# Patient Record
Sex: Female | Born: 1975 | Race: White | Hispanic: No | Marital: Married | Smoking: Current every day smoker
Health system: Southern US, Community
[De-identification: ages and names within clinical notes are randomized; demographics above are authoritative.]

## PROBLEM LIST (undated history)

## (undated) DIAGNOSIS — Z87442 Personal history of urinary calculi: Secondary | ICD-10-CM

## (undated) DIAGNOSIS — F419 Anxiety disorder, unspecified: Secondary | ICD-10-CM

## (undated) DIAGNOSIS — F1110 Opioid abuse, uncomplicated: Secondary | ICD-10-CM

## (undated) DIAGNOSIS — K529 Noninfective gastroenteritis and colitis, unspecified: Secondary | ICD-10-CM

## (undated) DIAGNOSIS — N200 Calculus of kidney: Secondary | ICD-10-CM

## (undated) HISTORY — PX: CHOLECYSTECTOMY: SHX55

## (undated) HISTORY — PX: SHOULDER SURGERY: SHX246

## (undated) HISTORY — PX: KNEE SURGERY: SHX244

## (undated) HISTORY — PX: OVARIAN CYST REMOVAL: SHX89

## (undated) HISTORY — PX: ABDOMINAL HYSTERECTOMY: SHX81

---

## 1998-10-12 ENCOUNTER — Other Ambulatory Visit: Admission: RE | Admit: 1998-10-12 | Discharge: 1998-10-12 | Payer: Self-pay | Admitting: Obstetrics and Gynecology

## 1999-01-27 ENCOUNTER — Other Ambulatory Visit: Admission: RE | Admit: 1999-01-27 | Discharge: 1999-01-27 | Payer: Self-pay | Admitting: Obstetrics and Gynecology

## 1999-03-09 ENCOUNTER — Encounter (INDEPENDENT_AMBULATORY_CARE_PROVIDER_SITE_OTHER): Payer: Self-pay | Admitting: Specialist

## 1999-03-09 ENCOUNTER — Other Ambulatory Visit: Admission: RE | Admit: 1999-03-09 | Discharge: 1999-03-09 | Payer: Self-pay | Admitting: *Deleted

## 1999-04-15 ENCOUNTER — Other Ambulatory Visit: Admission: RE | Admit: 1999-04-15 | Discharge: 1999-04-15 | Payer: Self-pay | Admitting: Obstetrics and Gynecology

## 1999-04-15 ENCOUNTER — Encounter (INDEPENDENT_AMBULATORY_CARE_PROVIDER_SITE_OTHER): Payer: Self-pay

## 1999-10-04 ENCOUNTER — Other Ambulatory Visit: Admission: RE | Admit: 1999-10-04 | Discharge: 1999-10-04 | Payer: Self-pay | Admitting: *Deleted

## 2000-04-18 ENCOUNTER — Other Ambulatory Visit: Admission: RE | Admit: 2000-04-18 | Discharge: 2000-04-18 | Payer: Self-pay | Admitting: *Deleted

## 2005-03-28 HISTORY — PX: DILATION AND CURETTAGE OF UTERUS: SHX78

## 2007-09-18 ENCOUNTER — Emergency Department (HOSPITAL_COMMUNITY): Admission: EM | Admit: 2007-09-18 | Discharge: 2007-09-18 | Payer: Self-pay | Admitting: Emergency Medicine

## 2008-04-08 ENCOUNTER — Emergency Department (HOSPITAL_BASED_OUTPATIENT_CLINIC_OR_DEPARTMENT_OTHER): Admission: EM | Admit: 2008-04-08 | Discharge: 2008-04-08 | Payer: Self-pay | Admitting: Emergency Medicine

## 2008-08-13 ENCOUNTER — Emergency Department (HOSPITAL_BASED_OUTPATIENT_CLINIC_OR_DEPARTMENT_OTHER): Admission: EM | Admit: 2008-08-13 | Discharge: 2008-08-13 | Payer: Self-pay | Admitting: Emergency Medicine

## 2008-08-13 ENCOUNTER — Ambulatory Visit: Payer: Self-pay | Admitting: Interventional Radiology

## 2008-12-09 ENCOUNTER — Ambulatory Visit: Payer: Self-pay | Admitting: Radiology

## 2008-12-09 ENCOUNTER — Emergency Department (HOSPITAL_BASED_OUTPATIENT_CLINIC_OR_DEPARTMENT_OTHER): Admission: EM | Admit: 2008-12-09 | Discharge: 2008-12-09 | Payer: Self-pay | Admitting: Emergency Medicine

## 2009-01-16 ENCOUNTER — Encounter: Admission: RE | Admit: 2009-01-16 | Discharge: 2009-01-16 | Payer: Self-pay | Admitting: Orthopaedic Surgery

## 2009-06-10 ENCOUNTER — Emergency Department (HOSPITAL_BASED_OUTPATIENT_CLINIC_OR_DEPARTMENT_OTHER): Admission: EM | Admit: 2009-06-10 | Discharge: 2009-06-10 | Payer: Self-pay | Admitting: Emergency Medicine

## 2009-06-10 ENCOUNTER — Ambulatory Visit: Payer: Self-pay | Admitting: Diagnostic Radiology

## 2009-08-26 ENCOUNTER — Emergency Department (HOSPITAL_BASED_OUTPATIENT_CLINIC_OR_DEPARTMENT_OTHER): Admission: EM | Admit: 2009-08-26 | Discharge: 2009-08-26 | Payer: Self-pay | Admitting: Emergency Medicine

## 2010-05-03 ENCOUNTER — Emergency Department (INDEPENDENT_AMBULATORY_CARE_PROVIDER_SITE_OTHER): Payer: BC Managed Care – PPO

## 2010-05-03 ENCOUNTER — Emergency Department (HOSPITAL_BASED_OUTPATIENT_CLINIC_OR_DEPARTMENT_OTHER)
Admission: EM | Admit: 2010-05-03 | Discharge: 2010-05-03 | Disposition: A | Discharge status: Home / Self Care | Payer: BC Managed Care – PPO | Attending: Emergency Medicine | Admitting: Emergency Medicine

## 2010-05-03 DIAGNOSIS — F17200 Nicotine dependence, unspecified, uncomplicated: Secondary | ICD-10-CM

## 2010-05-03 DIAGNOSIS — J40 Bronchitis, not specified as acute or chronic: Secondary | ICD-10-CM | POA: Insufficient documentation

## 2010-05-03 DIAGNOSIS — R059 Cough, unspecified: Secondary | ICD-10-CM

## 2010-05-03 DIAGNOSIS — R05 Cough: Secondary | ICD-10-CM

## 2010-05-03 DIAGNOSIS — R0602 Shortness of breath: Secondary | ICD-10-CM

## 2010-06-21 LAB — URINALYSIS, ROUTINE W REFLEX MICROSCOPIC
Glucose, UA: NEGATIVE mg/dL
Protein, ur: NEGATIVE mg/dL
Specific Gravity, Urine: 1.036 — ABNORMAL HIGH (ref 1.005–1.030)
pH: 6.5 (ref 5.0–8.0)

## 2010-06-21 LAB — PREGNANCY, URINE: Preg Test, Ur: NEGATIVE

## 2010-07-12 LAB — URINALYSIS, ROUTINE W REFLEX MICROSCOPIC
Bilirubin Urine: NEGATIVE
Glucose, UA: NEGATIVE mg/dL
Hgb urine dipstick: NEGATIVE
Ketones, ur: NEGATIVE mg/dL
Specific Gravity, Urine: 1.024 (ref 1.005–1.030)
Urobilinogen, UA: 0.2 mg/dL (ref 0.0–1.0)

## 2010-07-12 LAB — PREGNANCY, URINE: Preg Test, Ur: NEGATIVE

## 2010-07-21 ENCOUNTER — Emergency Department (HOSPITAL_BASED_OUTPATIENT_CLINIC_OR_DEPARTMENT_OTHER)
Admission: EM | Admit: 2010-07-21 | Discharge: 2010-07-21 | Disposition: A | Discharge status: Home / Self Care | Payer: BC Managed Care – PPO | Attending: Emergency Medicine | Admitting: Emergency Medicine

## 2010-07-21 DIAGNOSIS — R112 Nausea with vomiting, unspecified: Secondary | ICD-10-CM | POA: Insufficient documentation

## 2010-07-23 ENCOUNTER — Institutional Professional Consult (permissible substitution): Payer: BC Managed Care – PPO | Admitting: Internal Medicine

## 2010-12-23 LAB — POCT PREGNANCY, URINE: Preg Test, Ur: NEGATIVE

## 2010-12-23 LAB — URINALYSIS, ROUTINE W REFLEX MICROSCOPIC
Glucose, UA: NEGATIVE
Nitrite: POSITIVE — AB
Urobilinogen, UA: >8 — ABNORMAL HIGH

## 2010-12-23 LAB — URINE CULTURE
Colony Count: NO GROWTH
Culture: NO GROWTH

## 2010-12-23 LAB — URINE MICROSCOPIC-ADD ON

## 2011-09-19 LAB — OB RESULTS CONSOLE RPR: RPR: NONREACTIVE

## 2011-09-19 LAB — OB RESULTS CONSOLE HIV ANTIBODY (ROUTINE TESTING): HIV: NONREACTIVE

## 2011-09-19 LAB — OB RESULTS CONSOLE RUBELLA ANTIBODY, IGM: Rubella: IMMUNE

## 2011-09-21 LAB — OB RESULTS CONSOLE HGB/HCT, BLOOD
HCT: 41 %
Hemoglobin: 14.1 g/dL

## 2011-09-21 LAB — OB RESULTS CONSOLE ANTIBODY SCREEN: Antibody Screen: NEGATIVE

## 2012-02-13 ENCOUNTER — Inpatient Hospital Stay (HOSPITAL_COMMUNITY)
Admission: AD | Admit: 2012-02-13 | Discharge: 2012-02-23 | DRG: 371 | Disposition: A | Discharge status: Home / Self Care | Payer: BC Managed Care – PPO | Source: Ambulatory Visit | Attending: Obstetrics and Gynecology | Admitting: Obstetrics and Gynecology

## 2012-02-13 ENCOUNTER — Encounter (HOSPITAL_COMMUNITY): Payer: Self-pay | Admitting: *Deleted

## 2012-02-13 DIAGNOSIS — O321XX0 Maternal care for breech presentation, not applicable or unspecified: Principal | ICD-10-CM | POA: Diagnosis present

## 2012-02-13 DIAGNOSIS — O4290 Premature rupture of membranes, unspecified as to length of time between rupture and onset of labor, unspecified weeks of gestation: Secondary | ICD-10-CM | POA: Diagnosis present

## 2012-02-13 DIAGNOSIS — O09529 Supervision of elderly multigravida, unspecified trimester: Secondary | ICD-10-CM | POA: Diagnosis present

## 2012-02-13 DIAGNOSIS — O42919 Preterm premature rupture of membranes, unspecified as to length of time between rupture and onset of labor, unspecified trimester: Secondary | ICD-10-CM

## 2012-02-13 LAB — URINALYSIS, ROUTINE W REFLEX MICROSCOPIC
Glucose, UA: NEGATIVE mg/dL
Specific Gravity, Urine: 1.015 (ref 1.005–1.030)
Urobilinogen, UA: 0.2 mg/dL (ref 0.0–1.0)
pH: 7 (ref 5.0–8.0)

## 2012-02-13 LAB — CBC
HCT: 32.6 % — ABNORMAL LOW (ref 36.0–46.0)
Hemoglobin: 11.8 g/dL — ABNORMAL LOW (ref 12.0–15.0)
MCV: 87.6 fL (ref 78.0–100.0)
RBC: 3.72 MIL/uL — ABNORMAL LOW (ref 3.87–5.11)
WBC: 18.6 10*3/uL — ABNORMAL HIGH (ref 4.0–10.5)

## 2012-02-13 MED ORDER — BETAMETHASONE SOD PHOS & ACET 6 (3-3) MG/ML IJ SUSP
12.0000 mg | INTRAMUSCULAR | Status: AC
  Administered 2012-02-13 – 2012-02-14 (×2): 12 mg via INTRAMUSCULAR
  Filled 2012-02-13 (×2): qty 2

## 2012-02-13 MED ORDER — SODIUM CHLORIDE 0.9 % IV SOLN
2.0000 g | Freq: Four times a day (QID) | INTRAVENOUS | Status: AC
  Administered 2012-02-13 – 2012-02-15 (×7): 2 g via INTRAVENOUS
  Filled 2012-02-13 (×8): qty 2000

## 2012-02-13 MED ORDER — ZOLPIDEM TARTRATE 5 MG PO TABS: 5.0000 mg | ORAL_TABLET | Freq: Every evening | ORAL | Status: DC | PRN

## 2012-02-13 MED ORDER — ACETAMINOPHEN 325 MG PO TABS
650.0000 mg | ORAL_TABLET | ORAL | Status: DC | PRN
  Administered 2012-02-14 – 2012-02-19 (×3): 650 mg via ORAL
  Filled 2012-02-13 (×3): qty 2

## 2012-02-13 MED ORDER — AMOXICILLIN 500 MG PO CAPS
500.0000 mg | ORAL_CAPSULE | Freq: Three times a day (TID) | ORAL | Status: DC
  Administered 2012-02-15 – 2012-02-20 (×14): 500 mg via ORAL
  Filled 2012-02-13 (×15): qty 1

## 2012-02-13 MED ORDER — PRENATAL MULTIVITAMIN CH
1.0000 | ORAL_TABLET | Freq: Every day | ORAL | Status: DC
  Administered 2012-02-15 – 2012-02-19 (×3): 1 via ORAL
  Filled 2012-02-13 (×2): qty 1

## 2012-02-13 MED ORDER — DOCUSATE SODIUM 100 MG PO CAPS
100.0000 mg | ORAL_CAPSULE | Freq: Every day | ORAL | Status: DC
  Administered 2012-02-15 – 2012-02-20 (×6): 100 mg via ORAL
  Filled 2012-02-13 (×6): qty 1

## 2012-02-13 MED ORDER — CALCIUM CARBONATE ANTACID 500 MG PO CHEW
2.0000 | CHEWABLE_TABLET | ORAL | Status: DC | PRN
  Administered 2012-02-14 – 2012-02-17 (×7): 400 mg via ORAL
  Filled 2012-02-13: qty 2
  Filled 2012-02-13: qty 1
  Filled 2012-02-13 (×4): qty 2
  Filled 2012-02-13: qty 1
  Filled 2012-02-13: qty 2

## 2012-02-13 NOTE — H&P (Signed)
Pt is a 36 year old female, G2P0010 at 94 2/7 weeks who presented to the ER c/o leakage of fluid since 1730 today. PNC was complicated by AMA. She had a normal first trimester screen. Pt has a history of smoking but quit when she became pregnant. In the ER pt was noted to have +pool,+fern. Her cervix is 80/0/-2 .  Pt is complaining of mild low back pain but no contractions are seen. PMHX:see [place] PE: VSSAF HEENT-wnl ABD- gravid, nontender, no contractions No CVAT FHTS- reactive without decels IMP/ IUP at 32 1/2 weeks with PPROM         AMA PLAN/ Admit            Start Ampicillin, Betamethasone            Check ultrasound

## 2012-02-13 NOTE — MAU Note (Signed)
Pt reports leaking fluid since 1800, lower back pain.

## 2012-02-13 NOTE — MAU Provider Note (Signed)
  History     CSN: [id number]  Arrival date and time: 02/13/12 2132   None     Chief Complaint  Patient presents with  . Rupture of Membranes   HPI Anna Huang is a 36 y.o. female @ [redacted]w[redacted]d gestation who presents to MAU with possible premature  rupture of membranes. Patient states that for the past few hours she has been leaking fluid. Having low back pain but no abdominal pain. The history was provided by the patient.  OB History    Grav Para Term Preterm Abortions TAB SAB Ect Mult Living   2 0 0 0 1 0 1 0 0 0       History reviewed. No pertinent past medical history.  History reviewed. No pertinent past surgical history.  History reviewed. No pertinent family history.  History  Substance Use Topics  . Smoking status: Not on file  . Smokeless tobacco: Not on file  . Alcohol Use: Not on file    Allergies: No Known Allergies  No prescriptions prior to admission    ROS: as stated in HPI Physical Exam   Blood pressure 131/55, pulse 97, temperature 98 F (36.7 C), temperature source Oral, resp. rate 18, last menstrual period 07/06/2011, SpO2 100.00%.  Physical Exam  Nursing note and vitals reviewed. Constitutional: She is oriented to person, place, and time. She appears well-developed and well-nourished. No distress.  HENT:  Head: Normocephalic and atraumatic.  Eyes: EOM are normal.  Neck: Neck supple.  Cardiovascular: Normal rate.   Respiratory: Effort normal.  GI: Soft. There is no tenderness.  Genitourinary:       External genitalia without lesions. SSE pooling clear fluid vaginal vault. Fern positive.  Musculoskeletal:       Low back pain with movement.  Neurological: She is alert and oriented to person, place, and time.  Skin: Skin is warm and dry.  Psychiatric: She has a normal mood and affect. Her behavior is normal. Judgment and thought content normal.   EFM: baseline 140, reassuring,  Irritability  MAU Course: Discussed with Dr. Dareen Piano and he  will evaluate the patient in MAU.  Procedures  Adrianne Shackleton, RN, FNP, [facility] 02/13/2012, 9:54 PM

## 2012-02-14 ENCOUNTER — Inpatient Hospital Stay (HOSPITAL_COMMUNITY): Payer: BC Managed Care – PPO

## 2012-02-14 ENCOUNTER — Encounter (HOSPITAL_COMMUNITY): Payer: Self-pay

## 2012-02-14 MED ORDER — PRENATAL MULTIVITAMIN CH
1.0000 | ORAL_TABLET | Freq: Every day | ORAL | Status: DC
  Administered 2012-02-14 – 2012-02-20 (×6): 1 via ORAL
  Filled 2012-02-14 (×5): qty 1

## 2012-02-14 MED ORDER — FAMOTIDINE 20 MG PO TABS
40.0000 mg | ORAL_TABLET | Freq: Every day | ORAL | Status: DC
  Administered 2012-02-14 – 2012-02-20 (×7): 40 mg via ORAL
  Filled 2012-02-14: qty 2
  Filled 2012-02-14 (×5): qty 1
  Filled 2012-02-14: qty 2
  Filled 2012-02-14 (×2): qty 1
  Filled 2012-02-14: qty 2
  Filled 2012-02-14: qty 1

## 2012-02-14 NOTE — Consult Note (Signed)
Neonatology Consult  Note:  At the request of the patients obstetrician Dr. Dareen Piano I met with Mrs. Lovering (and her mother) who is a 36 year old female, G2P0010 at 61 3/7 weeks who presented with PPROM which occurred at 1730 on 11/18.  She is currently being treated with latency antibiotics and will receive her second dose of betamethasone tonight at 22:00.  US shows oligo. We reviewed initial delivery room management, including CPAP, [place], and low but certainly possible need for intubation for surfactant administration.  We discussed feeding immaturity and need for full po intake with multiple days of good weight gain and no apnea or bradycardia before discharge.  We reviewed increased risk of jaundice, infection, and temperature instability.   Discussed likely length of stay. Thank you for allowing [place] to participate in her care.  Please call with questions.  John Giovanni, DO  Neonatologist  Face to face time 20 min.

## 2012-02-14 NOTE — Progress Notes (Signed)
HD#2 Pt without complaints. States that she is not having contractions. States that she continues to have clear fluid per vagina.  Ultrasound- EFW 4-9, frank breech, oligo, cervix not seen. Labs- WBC-18.6 IMP/ stable, no evidence of labor. PLAN/ continue antibiotics and betamethasone.             NICU consult             C/S is she goes into labor.

## 2012-02-14 NOTE — Progress Notes (Signed)
Monitors readjusted

## 2012-02-14 NOTE — Progress Notes (Signed)
UR done. 

## 2012-02-14 NOTE — Progress Notes (Signed)
Transported from 173 to room 157 on antenatal. Pt denies c/o.

## 2012-02-15 NOTE — Progress Notes (Signed)
Late entry  Pt denies abd tenderness, fever, contractions or other complaints.  No bleeding.  Good fetal movement.  Filed Vitals:   02/15/12 0900 02/15/12 1031 02/15/12 1242 02/15/12 1245  BP: 119/59  111/65 111/65  Pulse: 102  102 102  Temp: 98.7 F (37.1 C)  98.5 F (36.9 C)   TempSrc: Oral  Oral   Resp: 16  16   Height:      Weight:  76.386 kg (168 lb 6.4 oz)    SpO2: 100%       Abd: soft, NT Ext: no CT  Lab Results  Component Value Date   WBC 18.6* 02/13/2012   HGB 11.8* 02/13/2012   HCT 32.6* 02/13/2012   MCV 87.6 02/13/2012   PLT 254 02/13/2012      A/P PPROM @ 32.4, breech presentation S/p beta x 2 S/p IV abx, currently receiving amoxicillin po for latency NST q shift, will allow short shower with NST to be performed immediately after.  Discussed risks such as cord prolapse with pt and nurse.   Continue other routine care.   Philip Aspen

## 2012-02-16 LAB — CULTURE, BETA STREP (GROUP B ONLY)

## 2012-02-16 NOTE — Progress Notes (Signed)
Patient ID: Anna Huang, female   DOB: 11/08/1975, 36 y.o.   MRN: [id number]  S: Pt feeling well.  She denies bleeding but continues to leak fluid. She has ocassional cramping.  Denies fevers or chills O:  Filed Vitals:   02/16/12 0417 02/16/12 0700 02/16/12 0829 02/16/12 1158  BP: 101/63  116/65 114/55  Pulse: 92  99 95  Temp: 98.5 F (36.9 C)  98.7 F (37.1 C) 98.8 F (37.1 C)  TempSrc: Oral  Oral Oral  Resp: 18 18 18 18   Height:      Weight:      SpO2:       AOx3, NAD Gravid soft, NT FHT 140 toco irregular  A/P: 36 yo G2P0010 @ 32+5 PPROM 1) S/p BMZ x 2 2) Continue bedrest with bathroom privledges.  OK for wheelchair ride 3) SCDs for DVT prophylaxis 4) C/S for delivery d/t breech presentation

## 2012-02-17 MED ORDER — SODIUM CHLORIDE 0.9 % IJ SOLN
3.0000 mL | Freq: Two times a day (BID) | INTRAMUSCULAR | Status: DC
  Administered 2012-02-17 – 2012-02-19 (×5): 3 mL via INTRAVENOUS

## 2012-02-17 NOTE — Progress Notes (Addendum)
S: Pt feeling well.  She denies bleeding but continues to leak fluid. She has ocassional cramping.  Denies fevers or chills O:  Filed Vitals:   02/16/12 0417 02/16/12 0700 02/16/12 0829 02/16/12 1158  BP: 101/63  116/65 114/55  Pulse: 92  99 95  Temp: 98.5 F (36.9 C)  98.7 F (37.1 C) 98.8 F (37.1 C)  TempSrc: Oral  Oral Oral  Resp: 18 18 18 18   Height:      Weight:      SpO2:       AOx3, NAD Gravid soft, NT FHT 130, gstv, nst r last night toco irregular  A/P: 36 yo G2P0010 @ 32+6 PPROM 1) S/p BMZ x 2 2) Continue bedrest with bathroom privledges.  OK for wheelchair ride 3) SCDs for DVT prophylaxis 4) C/S for delivery d/t breech presentation

## 2012-02-17 NOTE — Progress Notes (Signed)
I visited with pt while making rounds on unit.  She was in good spirits and is staying positive.  She is reassured that the baby is doing well.  Although she did not expect this hospitalization, she is coping well with the situation and she has a good support system.  Please page as needs arise, [phone].  Agnes Lawrence Shley Dolby 11:40 AM   02/17/12 1100  Clinical Encounter Type  Visited With Patient  Visit Type Initial

## 2012-02-18 MED ORDER — CYCLOBENZAPRINE HCL 10 MG PO TABS
10.0000 mg | ORAL_TABLET | Freq: Three times a day (TID) | ORAL | Status: DC | PRN
Start: 2012-02-18 — End: 2012-02-20
  Administered 2012-02-18 – 2012-02-19 (×2): 10 mg via ORAL
  Filled 2012-02-18 (×3): qty 1

## 2012-02-18 NOTE — Progress Notes (Signed)
S: Pt feeling well.  She denies bleeding but continues to leak fluid. She has ocassional cramping.  Denies fevers or chills  O:  Filed Vitals:   02/18/12 0814  BP: 105/48  Pulse: 101  Temp: 98.1 F (36.7 C)  Resp: 20     AOx3, NAD Gravid soft, NT FHT 120s, gstv, nst r  toco irregular  A/P: 36 yo G2P0010 @ 33PPROM 1) S/p BMZ x 2 2) Continue bedrest with bathroom privledges.  OK for wheelchair ride 3) SCDs for DVT prophylaxis 4) C/S for delivery d/t breech presentation at 34 weeks.

## 2012-02-19 LAB — CBC
MCV: 88.1 fL (ref 78.0–100.0)
Platelets: 253 10*3/uL (ref 150–400)
RBC: 3.69 MIL/uL — ABNORMAL LOW (ref 3.87–5.11)
RDW: 12.9 % (ref 11.5–15.5)
WBC: 20.7 10*3/uL — ABNORMAL HIGH (ref 4.0–10.5)

## 2012-02-19 LAB — TYPE AND SCREEN: ABO/RH(D): O POS

## 2012-02-19 LAB — ABO/RH: ABO/RH(D): O POS

## 2012-02-19 NOTE — Progress Notes (Signed)
S: Pt feeling well.  She denies bleeding but continues to leak fluid. She has ocassional cramping.  Denies fevers or chills  O:  Filed Vitals:   02/18/12 2255 02/19/12 0647 02/19/12 0751 02/19/12 0752  BP: 105/53 104/61  70/32  Pulse: 104 90  100  Temp: 98.8 F (37.1 C) 98.5 F (36.9 C) 98 F (36.7 C)   TempSrc: Oral Oral Oral   Resp: 20 20 20    Height:      Weight:      SpO2:         AOx3, NAD Gravid soft, NT FHT 120s, gstv, nst r  toco irregular  A/P: 36 yo G2P0010 @ 33 1/7 PPROM ASSESSMENT  1) S/p BMZ x 2 2) Continue bedrest with bathroom privledges.  OK for wheelchair ride 3) SCDs for DVT prophylaxis 4) C/S for delivery d/t breech presentation at 34 weeks.

## 2012-02-20 ENCOUNTER — Encounter (HOSPITAL_COMMUNITY): Payer: Self-pay | Admitting: Anesthesiology

## 2012-02-20 ENCOUNTER — Encounter (HOSPITAL_COMMUNITY)
Admission: AD | Disposition: A | Discharge status: Home / Self Care | Payer: Self-pay | Source: Ambulatory Visit | Attending: Obstetrics and Gynecology

## 2012-02-20 ENCOUNTER — Inpatient Hospital Stay (HOSPITAL_COMMUNITY): Payer: BC Managed Care – PPO | Admitting: Anesthesiology

## 2012-02-20 LAB — CBC
HCT: 33.9 % — ABNORMAL LOW (ref 36.0–46.0)
MCH: 31.3 pg (ref 26.0–34.0)
MCHC: 35.7 g/dL (ref 30.0–36.0)
MCV: 87.6 fL (ref 78.0–100.0)
RDW: 12.9 % (ref 11.5–15.5)

## 2012-02-20 SURGERY — Surgical Case
Anesthesia: Spinal | Site: Abdomen | Wound class: Clean Contaminated

## 2012-02-20 MED ORDER — MEPERIDINE HCL 25 MG/ML IJ SOLN: 6.2500 mg | INTRAMUSCULAR | Status: DC | PRN

## 2012-02-20 MED ORDER — KETOROLAC TROMETHAMINE 60 MG/2ML IM SOLN
60.0000 mg | Freq: Once | INTRAMUSCULAR | Status: AC | PRN
  Administered 2012-02-20: 60 mg via INTRAMUSCULAR

## 2012-02-20 MED ORDER — LACTATED RINGERS IV SOLN
INTRAVENOUS | Status: DC | PRN
  Administered 2012-02-20 (×2): via INTRAVENOUS

## 2012-02-20 MED ORDER — NALBUPHINE HCL 10 MG/ML IJ SOLN: 5.0000 mg | INTRAMUSCULAR | Status: DC | PRN

## 2012-02-20 MED ORDER — CEFAZOLIN SODIUM-DEXTROSE 2-3 GM-% IV SOLR: 2.0000 g | INTRAVENOUS | Status: DC

## 2012-02-20 MED ORDER — CHLOROPROCAINE HCL 3 % IJ SOLN
INTRAMUSCULAR | Status: DC | PRN
  Administered 2012-02-20: 20 mL

## 2012-02-20 MED ORDER — WITCH HAZEL-GLYCERIN EX PADS: 1.0000 "application " | MEDICATED_PAD | CUTANEOUS | Status: DC | PRN

## 2012-02-20 MED ORDER — ONDANSETRON HCL 4 MG/2ML IJ SOLN
INTRAMUSCULAR | Status: AC
  Filled 2012-02-20: qty 2

## 2012-02-20 MED ORDER — DIPHENHYDRAMINE HCL 50 MG/ML IJ SOLN: 12.5000 mg | INTRAMUSCULAR | Status: DC | PRN

## 2012-02-20 MED ORDER — PRENATAL MULTIVITAMIN CH
1.0000 | ORAL_TABLET | Freq: Every day | ORAL | Status: DC
  Administered 2012-02-21 – 2012-02-23 (×3): 1 via ORAL
  Filled 2012-02-20 (×4): qty 1

## 2012-02-20 MED ORDER — NALOXONE HCL 1 MG/ML IJ SOLN: 1.0000 ug/kg/h | INTRAVENOUS | Status: DC | PRN

## 2012-02-20 MED ORDER — NALOXONE HCL 0.4 MG/ML IJ SOLN: 0.4000 mg | INTRAMUSCULAR | Status: DC | PRN

## 2012-02-20 MED ORDER — METOCLOPRAMIDE HCL 5 MG/ML IJ SOLN: 10.0000 mg | Freq: Three times a day (TID) | INTRAMUSCULAR | Status: DC | PRN

## 2012-02-20 MED ORDER — DIPHENHYDRAMINE HCL 25 MG PO CAPS: 25.0000 mg | ORAL_CAPSULE | ORAL | Status: DC | PRN

## 2012-02-20 MED ORDER — PHENYLEPHRINE 40 MCG/ML (10ML) SYRINGE FOR IV PUSH (FOR BLOOD PRESSURE SUPPORT)
PREFILLED_SYRINGE | INTRAVENOUS | Status: AC
  Filled 2012-02-20: qty 5

## 2012-02-20 MED ORDER — SCOPOLAMINE 1 MG/3DAYS TD PT72
1.0000 | MEDICATED_PATCH | Freq: Once | TRANSDERMAL | Status: DC
  Administered 2012-02-20: 1.5 mg via TRANSDERMAL

## 2012-02-20 MED ORDER — CITRIC ACID-SODIUM CITRATE 334-500 MG/5ML PO SOLN
ORAL | Status: AC
  Administered 2012-02-20: 30 mL
  Filled 2012-02-20: qty 15

## 2012-02-20 MED ORDER — LACTATED RINGERS IV SOLN
INTRAVENOUS | Status: DC | PRN
  Administered 2012-02-20: 16:00:00 via INTRAVENOUS

## 2012-02-20 MED ORDER — CEFAZOLIN SODIUM-DEXTROSE 2-3 GM-% IV SOLR
INTRAVENOUS | Status: DC | PRN
  Administered 2012-02-20: 2 g via INTRAVENOUS

## 2012-02-20 MED ORDER — SENNOSIDES-DOCUSATE SODIUM 8.6-50 MG PO TABS
2.0000 | ORAL_TABLET | Freq: Every day | ORAL | Status: DC
  Administered 2012-02-20 – 2012-02-22 (×3): 2 via ORAL

## 2012-02-20 MED ORDER — LACTATED RINGERS IV SOLN
INTRAVENOUS | Status: DC
  Administered 2012-02-21: 02:00:00 via INTRAVENOUS

## 2012-02-20 MED ORDER — ONDANSETRON HCL 4 MG PO TABS: 4.0000 mg | ORAL_TABLET | ORAL | Status: DC | PRN

## 2012-02-20 MED ORDER — DIBUCAINE 1 % RE OINT: 1.0000 "application " | TOPICAL_OINTMENT | RECTAL | Status: DC | PRN

## 2012-02-20 MED ORDER — EPHEDRINE 5 MG/ML INJ
INTRAVENOUS | Status: AC
  Filled 2012-02-20: qty 10

## 2012-02-20 MED ORDER — ONDANSETRON HCL 4 MG/2ML IJ SOLN: 4.0000 mg | Freq: Three times a day (TID) | INTRAMUSCULAR | Status: DC | PRN

## 2012-02-20 MED ORDER — OXYTOCIN 40 UNITS IN LACTATED RINGERS INFUSION - SIMPLE MED
62.5000 mL/h | INTRAVENOUS | Status: AC
  Administered 2012-02-20: 62.5 mL/h via INTRAVENOUS
  Filled 2012-02-20: qty 1000

## 2012-02-20 MED ORDER — FENTANYL CITRATE 0.05 MG/ML IJ SOLN
INTRAMUSCULAR | Status: AC
  Filled 2012-02-20: qty 2

## 2012-02-20 MED ORDER — ZOLPIDEM TARTRATE 5 MG PO TABS: 5.0000 mg | ORAL_TABLET | Freq: Every evening | ORAL | Status: DC | PRN

## 2012-02-20 MED ORDER — CHLOROPROCAINE HCL 3 % IJ SOLN
INTRAMUSCULAR | Status: AC
  Filled 2012-02-20: qty 20

## 2012-02-20 MED ORDER — DIPHENHYDRAMINE HCL 50 MG/ML IJ SOLN: 25.0000 mg | INTRAMUSCULAR | Status: DC | PRN

## 2012-02-20 MED ORDER — ONDANSETRON HCL 4 MG/2ML IJ SOLN
INTRAMUSCULAR | Status: DC | PRN
  Administered 2012-02-20: 4 mg via INTRAVENOUS

## 2012-02-20 MED ORDER — FENTANYL CITRATE 0.05 MG/ML IJ SOLN
25.0000 ug | INTRAMUSCULAR | Status: DC | PRN
  Administered 2012-02-20 (×2): 50 ug via INTRAVENOUS

## 2012-02-20 MED ORDER — OXYTOCIN 40 UNITS IN LACTATED RINGERS INFUSION - SIMPLE MED
INTRAVENOUS | Status: DC | PRN
  Administered 2012-02-20: 40 [IU] via INTRAVENOUS

## 2012-02-20 MED ORDER — KETOROLAC TROMETHAMINE 30 MG/ML IJ SOLN: 30.0000 mg | Freq: Four times a day (QID) | INTRAMUSCULAR | Status: DC | PRN

## 2012-02-20 MED ORDER — MORPHINE SULFATE 0.5 MG/ML IJ SOLN
INTRAMUSCULAR | Status: AC
  Filled 2012-02-20: qty 10

## 2012-02-20 MED ORDER — IBUPROFEN 600 MG PO TABS: 600.0000 mg | ORAL_TABLET | Freq: Four times a day (QID) | ORAL | Status: DC | PRN

## 2012-02-20 MED ORDER — FENTANYL CITRATE 0.05 MG/ML IJ SOLN
INTRAMUSCULAR | Status: AC
  Filled 2012-02-20: qty 5

## 2012-02-20 MED ORDER — BUPIVACAINE IN DEXTROSE 0.75-8.25 % IT SOLN
INTRATHECAL | Status: DC | PRN
  Administered 2012-02-20: 1.5 mL via INTRATHECAL

## 2012-02-20 MED ORDER — SIMETHICONE 80 MG PO CHEW
80.0000 mg | CHEWABLE_TABLET | ORAL | Status: DC | PRN
  Administered 2012-02-21 – 2012-02-23 (×3): 80 mg via ORAL

## 2012-02-20 MED ORDER — ONDANSETRON HCL 4 MG/2ML IJ SOLN: 4.0000 mg | INTRAMUSCULAR | Status: DC | PRN

## 2012-02-20 MED ORDER — PHENYLEPHRINE HCL 10 MG/ML IJ SOLN
INTRAMUSCULAR | Status: DC | PRN
  Administered 2012-02-20: 40 ug via INTRAVENOUS

## 2012-02-20 MED ORDER — LANOLIN HYDROUS EX OINT: 1.0000 "application " | TOPICAL_OINTMENT | CUTANEOUS | Status: DC | PRN

## 2012-02-20 MED ORDER — FENTANYL CITRATE 0.05 MG/ML IJ SOLN
INTRAMUSCULAR | Status: AC
  Administered 2012-02-20: 50 ug via INTRAVENOUS
  Filled 2012-02-20: qty 2

## 2012-02-20 MED ORDER — OXYCODONE-ACETAMINOPHEN 5-325 MG PO TABS
1.0000 | ORAL_TABLET | ORAL | Status: DC | PRN
  Administered 2012-02-21 (×3): 1 via ORAL
  Administered 2012-02-21: 2 via ORAL
  Administered 2012-02-21: 1 via ORAL
  Filled 2012-02-20: qty 2
  Filled 2012-02-20 (×3): qty 1
  Filled 2012-02-20 (×2): qty 2

## 2012-02-20 MED ORDER — DIPHENHYDRAMINE HCL 25 MG PO CAPS: 25.0000 mg | ORAL_CAPSULE | Freq: Four times a day (QID) | ORAL | Status: DC | PRN

## 2012-02-20 MED ORDER — KETOROLAC TROMETHAMINE 60 MG/2ML IM SOLN
INTRAMUSCULAR | Status: AC
  Administered 2012-02-20: 60 mg via INTRAMUSCULAR
  Filled 2012-02-20: qty 2

## 2012-02-20 MED ORDER — MENTHOL 3 MG MT LOZG: 1.0000 | LOZENGE | OROMUCOSAL | Status: DC | PRN

## 2012-02-20 MED ORDER — SODIUM CHLORIDE 0.9 % IJ SOLN: 3.0000 mL | INTRAMUSCULAR | Status: DC | PRN

## 2012-02-20 MED ORDER — CEFAZOLIN SODIUM-DEXTROSE 2-3 GM-% IV SOLR
INTRAVENOUS | Status: AC
  Filled 2012-02-20: qty 50

## 2012-02-20 MED ORDER — SIMETHICONE 80 MG PO CHEW
80.0000 mg | CHEWABLE_TABLET | Freq: Three times a day (TID) | ORAL | Status: DC
  Administered 2012-02-20 – 2012-02-22 (×3): 80 mg via ORAL

## 2012-02-20 MED ORDER — IBUPROFEN 600 MG PO TABS
600.0000 mg | ORAL_TABLET | Freq: Four times a day (QID) | ORAL | Status: DC
  Administered 2012-02-20 – 2012-02-23 (×11): 600 mg via ORAL
  Filled 2012-02-20 (×12): qty 1

## 2012-02-20 MED ORDER — MORPHINE SULFATE (PF) 0.5 MG/ML IJ SOLN
INTRAMUSCULAR | Status: DC | PRN
  Administered 2012-02-20: .1 mg via INTRATHECAL

## 2012-02-20 MED ORDER — OXYTOCIN 10 UNIT/ML IJ SOLN
INTRAMUSCULAR | Status: AC
  Filled 2012-02-20: qty 4

## 2012-02-20 MED ORDER — SCOPOLAMINE 1 MG/3DAYS TD PT72
MEDICATED_PATCH | TRANSDERMAL | Status: AC
  Filled 2012-02-20: qty 1

## 2012-02-20 MED ORDER — FENTANYL CITRATE 0.05 MG/ML IJ SOLN
INTRAMUSCULAR | Status: DC | PRN
  Administered 2012-02-20: 50 ug via INTRAVENOUS
  Administered 2012-02-20: 35 ug via INTRAVENOUS
  Administered 2012-02-20: 50 ug via INTRAVENOUS
  Administered 2012-02-20: 100 ug via INTRAVENOUS
  Administered 2012-02-20 (×2): 50 ug via INTRAVENOUS
  Administered 2012-02-20: 15 ug via INTRATHECAL

## 2012-02-20 MED ORDER — TETANUS-DIPHTH-ACELL PERTUSSIS 5-2.5-18.5 LF-MCG/0.5 IM SUSP
0.5000 mL | Freq: Once | INTRAMUSCULAR | Status: AC
  Administered 2012-02-23: 0.5 mL via INTRAMUSCULAR

## 2012-02-20 SURGICAL SUPPLY — 23 items
CLOTH BEACON ORANGE TIMEOUT ST (SAFETY) ×2 IMPLANT
DRSG COVADERM 4X10 (GAUZE/BANDAGES/DRESSINGS) ×2 IMPLANT
DURAPREP 26ML APPLICATOR (WOUND CARE) ×2 IMPLANT
ELECT REM PT RETURN 9FT ADLT (ELECTROSURGICAL) ×2
ELECTRODE REM PT RTRN 9FT ADLT (ELECTROSURGICAL) ×1 IMPLANT
GLOVE BIOGEL PI IND STRL 7.0 (GLOVE) ×2 IMPLANT
GLOVE BIOGEL PI INDICATOR 7.0 (GLOVE) ×2
GLOVE ECLIPSE 6.0 STRL STRAW (GLOVE) ×4 IMPLANT
GOWN PREVENTION PLUS LG XLONG (DISPOSABLE) ×6 IMPLANT
NS IRRIG 1000ML POUR BTL (IV SOLUTION) ×2 IMPLANT
PACK C SECTION WH (CUSTOM PROCEDURE TRAY) ×2 IMPLANT
PAD OB MATERNITY 4.3X12.25 (PERSONAL CARE ITEMS) ×2 IMPLANT
RETAINER VISCERAL (MISCELLANEOUS) ×2 IMPLANT
RTRCTR C-SECT PINK 25CM LRG (MISCELLANEOUS) ×2 IMPLANT
STAPLER VISISTAT 35W (STAPLE) ×2 IMPLANT
SUT VIC AB 0 CT1 27 (SUTURE) ×3
SUT VIC AB 0 CT1 27XBRD ANBCTR (SUTURE) ×3 IMPLANT
SUT VIC AB 1 CTX 36 (SUTURE) ×2
SUT VIC AB 1 CTX36XBRD ANBCTRL (SUTURE) ×2 IMPLANT
SUT VIC AB 3-0 CT1 27 (SUTURE) ×1
SUT VIC AB 3-0 CT1 TAPERPNT 27 (SUTURE) ×1 IMPLANT
TOWEL OR 17X24 6PK STRL BLUE (TOWEL DISPOSABLE) ×4 IMPLANT
TRAY FOLEY CATH 14FR (SET/KITS/TRAYS/PACK) ×2 IMPLANT

## 2012-02-20 NOTE — Progress Notes (Signed)
Order received that her saline lock can be d/c'd and that her Joseph Art can be only prn contractions.

## 2012-02-20 NOTE — Anesthesia Postprocedure Evaluation (Signed)
Anesthesia Post Note  Patient: Anna Huang  Procedure(s) Performed: Procedure(s) (LRB): CESAREAN SECTION (N/A)  Anesthesia type: Spinal  Patient location: PACU  Post pain: Pain level controlled  Post assessment: Post-op Vital signs reviewed  Last Vitals:  Filed Vitals:   02/20/12 1815  BP:   Pulse:   Temp:   Resp: 19    Post vital signs: Reviewed  Level of consciousness: awake  Complications: No apparent anesthesia complications

## 2012-02-20 NOTE — Progress Notes (Signed)
33 2/[redacted] weeks gestation, with PROM. c/s at 34 weeks.  Height  65" Weight 169 Lbs pre-pregnancy weight 136 Lbs.Pre-pregnancy  BMI 22.7  IBW 125 Lbs  Total weight gain 33 Lbs. Weight gain goals 25-35 Lbs.   Estimated needs: 17-1900 kcal/day, 64-74 grams protein/day, 2 liters fluid/day regular diet  Current diet prescription will provide for increased needs. No abnormal nutrition related labs  Nutrition Dx: Increased nutrient needs r/t pregnancy and fetal growth requirements aeb [redacted] weeks gestation.  No educational needs assessed at this time.  Elisabeth Cara M.Odis Luster LDN Neonatal Nutrition Support Specialist Pager [phone]

## 2012-02-20 NOTE — Anesthesia Preprocedure Evaluation (Addendum)
Anesthesia Evaluation  Patient identified by MRN, date of birth, ID band Patient awake    Reviewed: Allergy & Precautions, H&P , NPO status , Patient's Chart, lab work & pertinent test results, reviewed documented beta blocker date and time   History of Anesthesia Complications Negative for: history of anesthetic complications  Airway Mallampati: III TM Distance: >3 FB Neck ROM: full    Dental  (+) Teeth Intact   Pulmonary former smoker (quit smoking 2 weeks ago),  breath sounds clear to auscultation        Cardiovascular negative cardio ROS  Rhythm:regular Rate:Normal     Neuro/Psych negative neurological ROS  negative psych ROS   GI/Hepatic negative GI ROS, Neg liver ROS,   Endo/Other  negative endocrine ROS  Renal/GU negative Renal ROS  negative genitourinary   Musculoskeletal   Abdominal   Peds  Hematology negative hematology ROS (+)   Anesthesia Other Findings 830 am - ate a bowl of cereal and sausage STAT CBC is pending   Reproductive/Obstetrics (+) Pregnancy (breech, PPROM, PTL)                          Anesthesia Physical Anesthesia Plan  ASA: II and emergent  Anesthesia Plan: Spinal   Post-op Pain Management:    Induction:   Airway Management Planned:   Additional Equipment:   Intra-op Plan:   Post-operative Plan:   Informed Consent: I have reviewed the patients History and Physical, chart, labs and discussed the procedure including the risks, benefits and alternatives for the proposed anesthesia with the patient or authorized representative who has indicated his/her understanding and acceptance.     Plan Discussed with: Surgeon and CRNA  Anesthesia Plan Comments:         Anesthesia Quick Evaluation

## 2012-02-20 NOTE — Anesthesia Procedure Notes (Signed)
Spinal  Patient location during procedure: OR Start time: 02/20/2012 3:42 PM Staffing Performed by: anesthesiologist  Preanesthetic Checklist Completed: patient identified, site marked, surgical consent, pre-op evaluation, timeout performed, IV checked, risks and benefits discussed and monitors and equipment checked Spinal Block Patient position: sitting Prep: site prepped and draped and DuraPrep Patient monitoring: heart rate, cardiac monitor, continuous pulse ox and blood pressure Approach: midline Location: L3-4 Injection technique: single-shot Needle Needle type: Sprotte  Needle gauge: 24 G Needle length: 9 cm Assessment Sensory level: T4 Additional Notes Clear free flow CSF on first attempt.  No paresthesia.  Patient tolerated procedure well.  Jasmine December, MD

## 2012-02-20 NOTE — Consult Note (Signed)
Neonatology Note:   Attendance at C-section:    I was asked to attend this primary C/S at 33 2/7 weeks due to breech presentation in preterm labor. The mother is a G2P0A1 O pos, GBS neg with PPROM. ROM on 11/18, fluid clear. Mother received Ampicillin at that time and got 2 doses of Betamethasone on 11/18-19. At delivery,  infant vigorous with good spontaneous cry and tone. Needed only minimal bulb suctioning. Ap 9/9. Lungs clear to ausc in DR. Held by parents in the DR, then transported to the NICU in room air for further care.   Deatra James, MD

## 2012-02-20 NOTE — Progress Notes (Signed)
Ur chart review completed.  

## 2012-02-20 NOTE — Op Note (Addendum)
Patient Name: Anna Huang MRN: [id number]  Date of Surgery: 02/20/2012    PREOPERATIVE DIAGNOSIS: Premature Rupture of Membranes, Premature labor, Breech Presentation  POSTOPERATIVE DIAGNOSIS: Premature Rupture of Membranes, Premature labor, Breech Presentation   PROCEDURE: Low transverse cesarean section  SURGEON: Paxten Appelt D. Arlyce Dice M.D.  ANESTHESIA: Spinal  ESTIMATED BLOOD LOSS: 800 ml  FINDINGS: Female, Homero Fellers Breech, 4 lbs 6 oz,; Apgar 9,9; nl adnexa and uterus.  INDICATIONS: PPROM, labor (cervix 2/100%), Homero Fellers breech presentation.  PROCEDURE IN DETAIL: The patient was taken to the operating room and spinal anesthesia was placed.  She was then placed in the supine position with left lateral displacement of the uterus. The abdomen was prepped and draped in a sterile fashion and the bladder was catheterized.  A low transverse abdominal incision was made and carried down to the fascia. The fascia was opened transversely and the rectus sheath was dissected from the underlying rectus muscle. The rectus midline was identified and opened by sharp and blunt dissection. The peritoneum was opened. An Alexis retractor was placed and the lower uterine segment was identified, entered transversely by careful sharp dissection, and extended bluntly.  The infant was delivered as a Homero Fellers Breech without difficulty. The placenta was delivered and the uterus was bluntly curettage. The lower segment was closed with running interlocking Vicryl 1 suture.  A second imbricating Vicryl 1 suture line was placed.  Hemostasis was obtained with vertical mattress sutures. The peritoneum and rectus muscle were closed in the midline with running 3-0 Vicryl suture. The fascia was closed with running 0 Vicryl suture and the skin was closed with staples. All sponge and instrument counts were correct.  The patient tolerated the procedure well and left the operating room in good condition.

## 2012-02-20 NOTE — Progress Notes (Signed)
Patient has developed regular and severe contractions.  Cervix 2 cm/ 100% effaced.  Breech at 0 station and well applied to cervix.  Confirmed fetal position with bedside ultrasound.  Imp:  Active labor  Plan:  Cesarean delivery

## 2012-02-20 NOTE — Progress Notes (Signed)
No change in status.

## 2012-02-20 NOTE — Transfer of Care (Signed)
Immediate Anesthesia Transfer of Care Note  Patient: Anna Huang  Procedure(s) Performed: Procedure(s) (LRB) with comments: CESAREAN SECTION (N/A)  Patient Location: PACU  Anesthesia Type:Spinal  Level of Consciousness: awake, alert  and oriented  Airway & Oxygen Therapy: Patient Spontanous Breathing  Post-op Assessment: Report given to PACU RN and Post -op Vital signs reviewed and stable  Post vital signs: Reviewed and stable  Complications: No apparent anesthesia complications

## 2012-02-21 ENCOUNTER — Encounter (HOSPITAL_COMMUNITY): Payer: Self-pay | Admitting: *Deleted

## 2012-02-21 LAB — CBC
HCT: 31.9 % — ABNORMAL LOW (ref 36.0–46.0)
Hemoglobin: 11.4 g/dL — ABNORMAL LOW (ref 12.0–15.0)
MCH: 31.2 pg (ref 26.0–34.0)
MCV: 87.4 fL (ref 78.0–100.0)
RBC: 3.65 MIL/uL — ABNORMAL LOW (ref 3.87–5.11)

## 2012-02-21 LAB — RPR: RPR Ser Ql: NONREACTIVE

## 2012-02-21 MED ORDER — DOCUSATE SODIUM 100 MG PO CAPS
100.0000 mg | ORAL_CAPSULE | Freq: Two times a day (BID) | ORAL | Status: DC
  Administered 2012-02-21 – 2012-02-23 (×4): 100 mg via ORAL
  Filled 2012-02-21 (×4): qty 1

## 2012-02-21 MED ORDER — HYDROMORPHONE HCL 2 MG PO TABS
2.0000 mg | ORAL_TABLET | ORAL | Status: DC | PRN
  Administered 2012-02-21 – 2012-02-23 (×10): 2 mg via ORAL
  Filled 2012-02-21 (×11): qty 1

## 2012-02-21 NOTE — Addendum Note (Signed)
Addendum  created 02/21/12 0958 by Shanon Payor, CRNA   Modules edited:Notes Section

## 2012-02-21 NOTE — Clinical Social Work Maternal (Signed)
Clinical Social Work Department PSYCHOSOCIAL ASSESSMENT - MATERNAL/CHILD 02/21/2012  Patient:  Anna Huang, Anna Huang  Account Number:  [id number]  Admit Date:  02/13/2012  Marjo Bicker Name:   Philippa Sicks    Clinical Social Worker:  Lulu Riding, LCSW   Date/Time:  02/21/2012 11:30 AM  Date Referred:  02/21/2012   Referral source  NICU     Referred reason  NICU   Other referral source:    I:  FAMILY / HOME ENVIRONMENT Child's legal guardian:  PARENT  Guardian - Name Guardian - Age Guardian - Address  Anna Huang [address], [place], [place] [address]  Anna Huang  same   Other household support members/support persons Other support:   MOB reports having a good support system.  She states her parents are neighbors and other relatives live near by.    II  PSYCHOSOCIAL DATA Information Source:  Patient Interview  Event organiser Employment:   MOB works for [employer] and has 12 weeks of maternity leave  FOB works for [employer] resources:  [employer]:    School / Grade:   Maternity Care Coordinator / Child Services Coordination / Early Interventions:  Cultural issues impacting care:   None identified    III  STRENGTHS Strengths  Adequate Resources  Compliance with medical plan  Home prepared for Child (including basic supplies)  Other - See comment  Supportive family/friends  Understanding of illness   Strength comment:  MOB thinks she will be taking baby to [facility] for follow up.  She would like a list in the event she changes her mind.  CSW provided.   IV  RISK FACTORS AND CURRENT PROBLEMS Current Problem:  None     V  SOCIAL WORK ASSESSMENT CSW met with MOB in her third floor room/304 to introduce myself, complete assessment and evaluate how she is coping with baby's premature birth and admission to NICU.  MOB was very talkative and states she and baby.  She states that she had  a week to prepare herself for baby's premature birth since her water broke and she thinks this has helped her cope.  She states baby is doing great and   she told CSW that she is hopeful for baby's discharge at the end of next week.  CSW cautioned her about getting her hopes up and advised to prepare, but not anticipate.  She stated understanding and realizes the necessity of the situation and states she just wants the best for her baby.  She has a very good attitude and seems to be in good spirits.  She states she and her husband didn't think they could have children and that baby is a miracle.  They are ecstatic about her arrival.  MOB states NICU staff has been wonderful and she has no questions about what to expect at this time.  MOB states there is no reason to stress since that won't help anything.  We briefly discussed PPD signs and symptoms and she states she is not going to let herself get down because she has waited 10 years for this baby, but states she feels comfortable talking with her doctor if needed.  She reports having everything ready for baby at home and no issues with transportation while she is recovering from her c/section.  She states she may want to go back to work to save time for baby's homecoming.  CSW advised she talk  with her doctor.  CSW explained support services offered by NICU CSW.  MOB seemed appreciative. CSW has no social concerns at this time.      VI SOCIAL WORK PLAN Social Work Plan  Psychosocial Support/Ongoing Assessment of Needs   Type of pt/family education:   Common emotions related to NICU  PPD signs and symptoms  importance of self care   If child protective services report - county:   If child protective services report - date:   Information/referral to community resources comment:   No referral needs identified at this time.   Other social work plan:

## 2012-02-21 NOTE — Progress Notes (Signed)
Subjective: Postpartum Day 1: Cesarean Delivery Patient reports incisional pain, tolerating PO and no problems voiding.    Objective: Vital signs in last 24 hours: Temp:  [97.5 F (36.4 C)-98.3 F (36.8 C)] 97.7 F (36.5 C) (11/26 1000) Pulse Rate:  [83-106] 106  (11/26 1000) Resp:  [14-22] 20  (11/26 1000) BP: (99-122)/(43-67) 104/65 mmHg (11/26 1000) SpO2:  [96 %-100 %] 98 % (11/26 1000)  Physical Exam:  General: alert, cooperative and appears stated age 36: appropriate Uterine Fundus: firm Incision: dressing intact without soilage DVT Evaluation: No evidence of DVT seen on physical exam.   Basename 02/21/12 0600 02/20/12 1512  HGB 11.4* 12.1  HCT 31.9* 33.9*    Assessment/Plan: Status post Cesarean section. Doing well postoperatively.  Continue current care. Baby doing well in NICU on room air.  Feeding well  Mauri Temkin H. 02/21/2012, 10:32 AM

## 2012-02-21 NOTE — Anesthesia Postprocedure Evaluation (Signed)
  Anesthesia Post-op Note  Patient: Anna Huang  Procedure(s) Performed: Procedure(s) (LRB) with comments: CESAREAN SECTION (N/A)  Patient Location: [facility] Unit  Anesthesia Type:Spinal  Level of Consciousness: awake, alert  and oriented  Airway and Oxygen Therapy: Patient Spontanous Breathing  Post-op Pain: none  Post-op Assessment: Post-op Vital signs reviewed, Patient's Cardiovascular Status Stable, No headache, No backache, No residual numbness and No residual motor weakness  Post-op Vital Signs: Reviewed and stable  Complications: No apparent anesthesia complications

## 2012-02-22 ENCOUNTER — Encounter (HOSPITAL_COMMUNITY)
Admission: RE | Admit: 2012-02-22 | Discharge: 2012-02-22 | Disposition: A | Discharge status: Home / Self Care | Payer: BC Managed Care – PPO | Source: Ambulatory Visit

## 2012-02-22 DIAGNOSIS — O923 Agalactia: Secondary | ICD-10-CM | POA: Insufficient documentation

## 2012-02-22 NOTE — Progress Notes (Signed)
  Patient is eating, ambulating, voiding.  Pain control is good.  Filed Vitals:   02/21/12 1400 02/21/12 1800 02/21/12 2144 02/22/12 0647  BP: 119/68 116/65 101/69 111/60  Pulse: 94 120 105 116  Temp: 97.5 F (36.4 C) 98.5 F (36.9 C) 98.3 F (36.8 C) 99.2 F (37.3 C)  TempSrc: Oral Oral Oral Oral  Resp: 18 18 18 18   Height:      Weight:      SpO2: 99% 98% 98% 100%    lungs:   clear to auscultation cor:    RRR Abdomen:  soft, appropriate tenderness, incisions intact and without erythema or exudate ex:    no cords   Lab Results  Component Value Date   WBC 28.2* 02/21/2012   HGB 11.4* 02/21/2012   HCT 31.9* 02/21/2012   MCV 87.4 02/21/2012   PLT 255 02/21/2012    --/--/O POS (11/24 1018)/RI  A/P    Post operative day 2.  Routine post op and postpartum care.  Expect d/c tomorrow.  Percocet for pain control.

## 2012-02-23 ENCOUNTER — Encounter (HOSPITAL_COMMUNITY): Payer: Self-pay | Admitting: Obstetrics & Gynecology

## 2012-02-23 MED ORDER — DOCUSATE SODIUM 100 MG PO CAPS: 100.0000 mg | ORAL_CAPSULE | Freq: Two times a day (BID) | ORAL | Status: DC

## 2012-02-23 MED ORDER — OXYCODONE-ACETAMINOPHEN 5-325 MG PO TABS: 2.0000 | ORAL_TABLET | ORAL | Status: DC | PRN

## 2012-02-23 MED ORDER — IBUPROFEN 600 MG PO TABS: 600.0000 mg | ORAL_TABLET | Freq: Four times a day (QID) | ORAL | Status: DC | PRN

## 2012-02-23 NOTE — Discharge Summary (Signed)
Obstetric Discharge Summary Reason for Admission: rupture of membranes Prenatal Procedures: NST and ultrasound Intrapartum Procedures: cesarean: low cervical, transverse Postpartum Procedures: none Complications-Operative and Postpartum: none Hemoglobin  Date Value Range Status  02/21/2012 11.4* 12.0 - 15.0 g/dL Final  4/54/0981 19.1   Final     HCT  Date Value Range Status  02/21/2012 31.9* 36.0 - 46.0 % Final  09/21/2011 41   Final    Physical Exam:  General: alert, cooperative and appears stated age Lochia: appropriate Uterine Fundus: firm Incision: healing well DVT Evaluation: No evidence of DVT seen on physical exam.  Discharge Diagnoses: Preterm delivery, PPROM, Cesarean Section  Discharge Information: Date: 02/23/2012 Activity: pelvic rest Diet: routine Medications: Ibuprofen, Colace and Percocet Condition: Improved Instructions: refer to practice specific booklet Discharge to: home Follow-up Information    Follow up with Mickel Baas, MD. In 4 weeks.   Contact information:   [address] [place] [address] [phone]          Newborn Data: Live born female  Birth Weight: 4 lb 6.2 oz (1990 g) APGAR: 9, 9  Home with mother.  Colbi Staubs H. 02/23/2012, 11:54 AM

## 2012-02-23 NOTE — Progress Notes (Signed)
Pt is discharged in the care of husband. Downstairs per wheelchair. Pt. Given discharge instructions .States she understood all instructions well. Questions asked and answered. States pain is improving. Staples were removed and steri strips were applied to low abd incision. Pt tolerated procedure well. Infant to remain in [facility]..Stable.

## 2012-02-28 ENCOUNTER — Encounter (HOSPITAL_COMMUNITY): Payer: Self-pay | Admitting: *Deleted

## 2012-02-28 ENCOUNTER — Inpatient Hospital Stay (HOSPITAL_COMMUNITY)
Admission: AD | Admit: 2012-02-28 | Discharge: 2012-02-28 | Disposition: A | Discharge status: Home / Self Care | Payer: BC Managed Care – PPO | Source: Ambulatory Visit | Attending: Obstetrics and Gynecology | Admitting: Obstetrics and Gynecology

## 2012-02-28 ENCOUNTER — Inpatient Hospital Stay (HOSPITAL_COMMUNITY): Payer: BC Managed Care – PPO

## 2012-02-28 DIAGNOSIS — IMO0002 Reserved for concepts with insufficient information to code with codable children: Secondary | ICD-10-CM

## 2012-02-28 DIAGNOSIS — O909 Complication of the puerperium, unspecified: Secondary | ICD-10-CM

## 2012-02-28 LAB — CBC WITH DIFFERENTIAL/PLATELET
Basophils Absolute: 0.1 10*3/uL (ref 0.0–0.1)
Basophils Relative: 0 % (ref 0–1)
Eosinophils Absolute: 0.2 10*3/uL (ref 0.0–0.7)
MCH: 30.3 pg (ref 26.0–34.0)
MCHC: 33.8 g/dL (ref 30.0–36.0)
Monocytes Relative: 7 % (ref 3–12)
Neutro Abs: 12.4 10*3/uL — ABNORMAL HIGH (ref 1.7–7.7)
Neutrophils Relative %: 72 % (ref 43–77)
RDW: 12.9 % (ref 11.5–15.5)

## 2012-02-28 MED ORDER — HYDROMORPHONE HCL PF 1 MG/ML IJ SOLN
2.0000 mg | Freq: Once | INTRAMUSCULAR | Status: AC
  Administered 2012-02-28: 2 mg via INTRAMUSCULAR
  Filled 2012-02-28 (×2): qty 1

## 2012-02-28 MED ORDER — CEPHALEXIN 500 MG PO CAPS
500.0000 mg | ORAL_CAPSULE | Freq: Once | ORAL | Status: AC
  Administered 2012-02-28: 500 mg via ORAL
  Filled 2012-02-28: qty 1

## 2012-02-28 MED ORDER — OXYCODONE-ACETAMINOPHEN 5-325 MG PO TABS: 1.0000 | ORAL_TABLET | ORAL | Status: DC | PRN

## 2012-02-28 MED ORDER — OXYCODONE-ACETAMINOPHEN 5-325 MG PO TABS: 2.0000 | ORAL_TABLET | ORAL | Status: DC | PRN

## 2012-02-28 MED ORDER — CEPHALEXIN 500 MG PO CAPS: 500.0000 mg | ORAL_CAPSULE | Freq: Four times a day (QID) | ORAL | Status: DC

## 2012-02-28 NOTE — MAU Provider Note (Signed)
CC: Drainage from Incision    First Provider Initiated Contact with Patient 02/28/12 1658      HPI Anna Huang is a 36 y.o. [phone] now 2 wks postop LTCS for PPROM, PTL, breech presenting with sudden onset of profuse blood-tinged watery drainage from her wound. Not preceded by physical exertion or popping sensation. She was sitting and felt her shirt and sweatpants suddenly saturate with fluid. The wound was not tender or erythematous this morning, but now feels swollen and tender.  History reviewed. No pertinent past medical history.  OB History    Grav Para Term Preterm Abortions TAB SAB Ect Mult Living   2 1 0 1 1 0 1 0 0 1      # Outc Date GA Lbr Len/2nd Wgt Sex Del Anes PTL Lv   1 SAB 2007           2 PRE 11/13 [redacted]w[redacted]d 00:00 4lb6.2oz(1.99kg) F LTCS Spinal  Yes      Past Surgical History  Procedure Date  . Cholecystectomy   . Ovarian cyst removal   . Shoulder surgery   . Knee surgery   . Dilation and curettage of uterus 2007  . Cesarean section 02/20/2012    Procedure: CESAREAN SECTION;  Surgeon: Mickel Baas, MD;  Location: [facility];  Service: Obstetrics;  Laterality: N/A;    History   Social History  . Marital Status: Married    Spouse Name: N/A    Number of Children: N/A  . Years of Education: N/A   Occupational History  . Not on file.   Social History Main Topics  . Smoking status: Never Smoker   . Smokeless tobacco: Not on file  . Alcohol Use: No  . Drug Use: No  . Sexually Active: Not Currently   Other Topics Concern  . Not on file   Social History Narrative  . No narrative on file    No current facility-administered medications on file prior to encounter.   Current Outpatient Prescriptions on File Prior to Encounter  Medication Sig Dispense Refill  . cyclobenzaprine (FLEXERIL) 5 MG tablet Take 5 mg by mouth 3 (three) times daily as needed. For back pain.      Marland Kitchen docusate sodium (COLACE) 100 MG capsule Take 1 capsule (100 mg total) by mouth 2  (two) times daily.  60 capsule  0  . ibuprofen (ADVIL,MOTRIN) 600 MG tablet Take 1 tablet (600 mg total) by mouth every 6 (six) hours as needed for pain.  90 tablet  0  . oxyCODONE-acetaminophen (ROXICET) 5-325 MG per tablet Take 2 tablets by mouth every 4 (four) hours as needed for pain. May take 1-2 tablets every 4-6 hours as needed for pain  46 tablet  0  . Prenatal Vit-Fe Fumarate-FA (PRENATAL MULTIVITAMIN) TABS Take 1 tablet by mouth daily.        No Known Allergies  ROS Pertinent items in HPI  PHYSICAL EXAM Filed Vitals:   02/28/12 1732  BP: 134/66  Pulse: 105  Temp: 98.2 F (36.8 C)  Resp: 18   General: Well nourished, well developed female in no acute distress, anxious. Large area of shirt and pants are soaked with clear fluid.  Cardiovascular: Normal rate Respiratory: Normal effort Abdomen: Soft, ND; Pfanensteil incision with intact steristrips with some dried blood. Confluent erythema surrounding wound about 4 cm (area outined). Mildly edematous and very tender to tuch.  On removal of steristrips, scanty serous drainage expressed. Back: No CVAT Extremities: 1-2+ dependent edema  [place] *RADIOLOGY REPORT*  Clinical Data: Inflamed and draining wound at recent cesarean  section incision site.  LIMITED ULTRASOUND OF ABDOMINAL SOFT TISSUES  Technique: Ultrasound examination of the abdominal wall soft  tissues was performed in the area of clinical concern.  Comparison: None.  Findings: Targeted ultrasound examination of the anterior abdominal  wall soft tissues in the area of clinical concern at the site of  the surgical incision shows the presence of a complex fluid  collection which extends along the course of the surgical incision  and measures at least 2 x 6 cm in size. This could represent a  seroma or abscess; infection cannot be excluded by imaging.  IMPRESSION:  Complex fluid collection in the anterior abdominal wall soft  tissues along the course of the cesarean  section surgical incision,  which measures at least 2 x 6 cm. This could represent a  postoperative seroma or abscess.  Original Report Authenticated By: Myles Rosenthal, M.D. Results for orders placed during the hospital encounter of 02/28/12 (from the past 24 hour(s))  CBC WITH DIFFERENTIAL     Status: Abnormal   Collection Time   02/28/12  6:33 PM      Component Value Range   WBC 17.1 (*) 4.0 - 10.5 K/uL   RBC 3.04 (*) 3.87 - 5.11 MIL/uL   Hemoglobin 9.2 (*) 12.0 - 15.0 g/dL   HCT 19.1 (*) 47.8 - 29.5 %   MCV 89.5  78.0 - 100.0 fL   MCH 30.3  26.0 - 34.0 pg   MCHC 33.8  30.0 - 36.0 g/dL   RDW 62.1  30.8 - 65.7 %   Platelets 399  150 - 400 K/uL   Neutrophils Relative 72  43 - 77 %   Neutro Abs 12.4 (*) 1.7 - 7.7 K/uL   Lymphocytes Relative 19  12 - 46 %   Lymphs Abs 3.3  0.7 - 4.0 K/uL   Monocytes Relative 7  3 - 12 %   Monocytes Absolute 1.3 (*) 0.1 - 1.0 K/uL   Eosinophils Relative 1  0 - 5 %   Eosinophils Absolute 0.2  0.0 - 0.7 K/uL   Basophils Relative 0  0 - 1 %   Basophils Absolute 0.1  0.0 - 0.1 K/uL     MAU COURSE D/W Dr. Claiborne Billings who advises probing wound to fascia. Dilaudid 2 mg IM at 1745. Later discussion> proceed with [place] and CBC Following [place] attempted probe qwith Q-tip > about 5 cm to fascia in both directions after central incision opened slightly. Attempted to pack but pt intolerant and crying, anxious. Dr. Claiborne Billings saw pt.  ASSESSMENT  1. Wound seroma     PLAN Discharge home. See AVS for patient education.  Follow-up Information    Follow up with Mickel Baas, MD. Schedule an appointment as soon as possible for a visit in 3 days.   Contact information:   [address] [place] [address] [phone]           Medication List     As of 02/29/2012 10:17 AM    TAKE these medications         cephALEXin 500 MG capsule   Commonly known as: KEFLEX   Take 1 capsule (500 mg total) by mouth 4 (four) times daily.       cyclobenzaprine 5 MG tablet   Commonly known as: FLEXERIL   Take 5 mg by mouth 3 (three) times daily as needed. For back  pain.      docusate sodium 100 MG capsule   Commonly known as: COLACE   Take 1 capsule (100 mg total) by mouth 2 (two) times daily.      ibuprofen 600 MG tablet   Commonly known as: ADVIL,MOTRIN   Take 1 tablet (600 mg total) by mouth every 6 (six) hours as needed for pain.      oxyCODONE-acetaminophen 5-325 MG per tablet   Commonly known as: PERCOCET/ROXICET   Take 1 tablet by mouth every 4 (four) hours as needed for pain.      oxyCODONE-acetaminophen 5-325 MG per tablet   Commonly known as: PERCOCET/ROXICET   Take 2 tablets by mouth every 4 (four) hours as needed for pain. May take 1-2 tablets every 4-6 hours as needed for pain      prenatal multivitamin Tabs   Take 1 tablet by mouth daily.           Danae Orleans, CNM 02/28/2012 4:59 PM

## 2012-02-28 NOTE — MAU Note (Signed)
Pt states she was siting in the resturant and noticed a large amount of drainage from incision site, the front of pt shirt and pants soaked.

## 2013-01-04 ENCOUNTER — Encounter: Payer: Self-pay | Admitting: *Deleted

## 2013-01-04 ENCOUNTER — Ambulatory Visit (INDEPENDENT_AMBULATORY_CARE_PROVIDER_SITE_OTHER): Payer: Commercial Managed Care - PPO | Admitting: *Deleted

## 2013-01-04 DIAGNOSIS — Z23 Encounter for immunization: Secondary | ICD-10-CM

## 2014-01-27 ENCOUNTER — Encounter: Payer: Self-pay | Admitting: *Deleted

## 2016-09-16 ENCOUNTER — Other Ambulatory Visit: Payer: Self-pay | Admitting: Sports Medicine

## 2016-09-16 DIAGNOSIS — M25511 Pain in right shoulder: Secondary | ICD-10-CM

## 2016-10-18 ENCOUNTER — Other Ambulatory Visit: Payer: Commercial Managed Care - PPO

## 2016-10-18 ENCOUNTER — Inpatient Hospital Stay: Admission: RE | Admit: 2016-10-18 | Payer: Commercial Managed Care - PPO | Source: Ambulatory Visit

## 2016-10-24 ENCOUNTER — Emergency Department (HOSPITAL_BASED_OUTPATIENT_CLINIC_OR_DEPARTMENT_OTHER): Payer: 59

## 2016-10-24 ENCOUNTER — Emergency Department (HOSPITAL_BASED_OUTPATIENT_CLINIC_OR_DEPARTMENT_OTHER)
Admission: EM | Admit: 2016-10-24 | Discharge: 2016-10-24 | Disposition: A | Discharge status: Home / Self Care | Payer: 59 | Attending: Emergency Medicine | Admitting: Emergency Medicine

## 2016-10-24 ENCOUNTER — Encounter (HOSPITAL_BASED_OUTPATIENT_CLINIC_OR_DEPARTMENT_OTHER): Payer: Self-pay | Admitting: *Deleted

## 2016-10-24 DIAGNOSIS — Z79899 Other long term (current) drug therapy: Secondary | ICD-10-CM | POA: Diagnosis not present

## 2016-10-24 DIAGNOSIS — Z87891 Personal history of nicotine dependence: Secondary | ICD-10-CM | POA: Diagnosis not present

## 2016-10-24 DIAGNOSIS — R1084 Generalized abdominal pain: Secondary | ICD-10-CM | POA: Diagnosis not present

## 2016-10-24 DIAGNOSIS — R112 Nausea with vomiting, unspecified: Secondary | ICD-10-CM | POA: Diagnosis present

## 2016-10-24 DIAGNOSIS — R197 Diarrhea, unspecified: Secondary | ICD-10-CM | POA: Diagnosis not present

## 2016-10-24 HISTORY — DX: Opioid abuse, uncomplicated: F11.10

## 2016-10-24 LAB — CBC WITH DIFFERENTIAL/PLATELET
BASOS ABS: 0 10*3/uL (ref 0.0–0.1)
Basophils Relative: 0 %
EOS PCT: 0 %
Eosinophils Absolute: 0 10*3/uL (ref 0.0–0.7)
HCT: 47.7 % — ABNORMAL HIGH (ref 36.0–46.0)
HEMOGLOBIN: 17.4 g/dL — AB (ref 12.0–15.0)
LYMPHS ABS: 3 10*3/uL (ref 0.7–4.0)
LYMPHS PCT: 20 %
MCH: 30.8 pg (ref 26.0–34.0)
MCHC: 36.5 g/dL — ABNORMAL HIGH (ref 30.0–36.0)
MCV: 84.4 fL (ref 78.0–100.0)
Monocytes Absolute: 0.9 10*3/uL (ref 0.1–1.0)
Monocytes Relative: 6 %
NEUTROS ABS: 11.2 10*3/uL — AB (ref 1.7–7.7)
NEUTROS PCT: 74 %
PLATELETS: 510 10*3/uL — AB (ref 150–400)
RBC: 5.65 MIL/uL — AB (ref 3.87–5.11)
RDW: 12.7 % (ref 11.5–15.5)
WBC: 15.1 10*3/uL — AB (ref 4.0–10.5)

## 2016-10-24 LAB — COMPREHENSIVE METABOLIC PANEL
ALBUMIN: 5.1 g/dL — AB (ref 3.5–5.0)
ALT: 26 U/L (ref 14–54)
AST: 31 U/L (ref 15–41)
Alkaline Phosphatase: 98 U/L (ref 38–126)
Anion gap: 18 — ABNORMAL HIGH (ref 5–15)
BILIRUBIN TOTAL: 0.5 mg/dL (ref 0.3–1.2)
BUN: 18 mg/dL (ref 6–20)
CHLORIDE: 100 mmol/L — AB (ref 101–111)
CO2: 22 mmol/L (ref 22–32)
CREATININE: 1.05 mg/dL — AB (ref 0.44–1.00)
Calcium: 10.4 mg/dL — ABNORMAL HIGH (ref 8.9–10.3)
GFR calc Af Amer: >60 mL/min (ref >60)
GFR calc non Af Amer: >60 mL/min (ref >60)
GLUCOSE: 176 mg/dL — AB (ref 65–99)
POTASSIUM: 3 mmol/L — AB (ref 3.5–5.1)
Sodium: 140 mmol/L (ref 135–145)
Total Protein: 8.3 g/dL — ABNORMAL HIGH (ref 6.5–8.1)

## 2016-10-24 LAB — URINALYSIS, ROUTINE W REFLEX MICROSCOPIC
GLUCOSE, UA: NEGATIVE mg/dL
HGB URINE DIPSTICK: NEGATIVE
Ketones, ur: >80 mg/dL — AB
Nitrite: NEGATIVE
Protein, ur: 30 mg/dL — AB
SPECIFIC GRAVITY, URINE: 1.037 — AB (ref 1.005–1.030)
pH: 6 (ref 5.0–8.0)

## 2016-10-24 LAB — LIPASE, BLOOD: LIPASE: 33 U/L (ref 11–51)

## 2016-10-24 LAB — URINALYSIS, MICROSCOPIC (REFLEX)

## 2016-10-24 LAB — PREGNANCY, URINE: Preg Test, Ur: NEGATIVE

## 2016-10-24 MED ORDER — DICYCLOMINE HCL 20 MG PO TABS: 20.0000 mg | ORAL_TABLET | Freq: Two times a day (BID) | ORAL | 0 refills | Status: DC

## 2016-10-24 MED ORDER — POTASSIUM CHLORIDE CRYS ER 20 MEQ PO TBCR
40.0000 meq | EXTENDED_RELEASE_TABLET | Freq: Once | ORAL | Status: AC
  Administered 2016-10-24: 40 meq via ORAL
  Filled 2016-10-24: qty 2

## 2016-10-24 MED ORDER — SODIUM CHLORIDE 0.9 % IV BOLUS (SEPSIS)
1000.0000 mL | Freq: Once | INTRAVENOUS | Status: AC
  Administered 2016-10-24: 1000 mL via INTRAVENOUS

## 2016-10-24 MED ORDER — ONDANSETRON 4 MG PO TBDP: 4.0000 mg | ORAL_TABLET | Freq: Three times a day (TID) | ORAL | 0 refills | Status: DC | PRN

## 2016-10-24 MED ORDER — ONDANSETRON HCL 4 MG/2ML IJ SOLN
4.0000 mg | Freq: Once | INTRAMUSCULAR | Status: AC
  Administered 2016-10-24: 4 mg via INTRAVENOUS

## 2016-10-24 MED ORDER — METOCLOPRAMIDE HCL 5 MG/ML IJ SOLN
10.0000 mg | Freq: Once | INTRAMUSCULAR | Status: AC
  Administered 2016-10-24: 10 mg via INTRAVENOUS
  Filled 2016-10-24: qty 2

## 2016-10-24 MED ORDER — ONDANSETRON HCL 4 MG/2ML IJ SOLN
4.0000 mg | Freq: Once | INTRAMUSCULAR | Status: AC
  Administered 2016-10-24: 4 mg via INTRAVENOUS
  Filled 2016-10-24: qty 2

## 2016-10-24 MED ORDER — MORPHINE SULFATE (PF) 4 MG/ML IV SOLN
4.0000 mg | Freq: Once | INTRAVENOUS | Status: AC
  Administered 2016-10-24: 4 mg via INTRAVENOUS
  Filled 2016-10-24: qty 1

## 2016-10-24 MED ORDER — ONDANSETRON HCL 4 MG/2ML IJ SOLN
INTRAMUSCULAR | Status: AC
  Administered 2016-10-24: 4 mg via INTRAVENOUS
  Filled 2016-10-24: qty 2

## 2016-10-24 MED ORDER — IOPAMIDOL (ISOVUE-300) INJECTION 61%: 100.0000 mL | Freq: Once | INTRAVENOUS | Status: DC | PRN

## 2016-10-24 NOTE — Discharge Instructions (Signed)
Take the prescribed medication as directed.  Recommend to continue oral hydration, bland diet for now and progress back to normal as tolerated. Follow-up with your primary care doctor. Return to the ED for new or worsening symptoms.

## 2016-10-24 NOTE — ED Notes (Signed)
Pt states she is still nauseated and that her pain is returning; however, pt has been able to keep down the 2 bottles of contrast from the CT scan with no episodes of vomiting.

## 2016-10-24 NOTE — ED Provider Notes (Signed)
Assumed care from PA Nadeau at shift change.  See her note for full H&P.  Briefly, 41 y.o. F here with vomiting and diarrhea since yesterday.  No sick contacts or changes in diet.  Denies pelvic pain, vaginal bleeding/discharge, or urinary symptoms.  Labs overall reassuring-- leukocytosis noted, but lower than previous. UA appears contaminated with to numerous to count squamous cells.  Plan:  CT pending.  dispo per results.  Results for orders placed or performed during the hospital encounter of 10/24/16  CBC with Differential  Result Value Ref Range   WBC 15.1 (H) 4.0 - 10.5 K/uL   RBC 5.65 (H) 3.87 - 5.11 MIL/uL   Hemoglobin 17.4 (H) 12.0 - 15.0 g/dL   HCT 16.147.7 (H) 09.636.0 - 04.546.0 %   MCV 84.4 78.0 - 100.0 fL   MCH 30.8 26.0 - 34.0 pg   MCHC 36.5 (H) 30.0 - 36.0 g/dL   RDW 40.912.7 81.111.5 - 91.415.5 %   Platelets 510 (H) 150 - 400 K/uL   Neutrophils Relative % 74 %   Neutro Abs 11.2 (H) 1.7 - 7.7 K/uL   Lymphocytes Relative 20 %   Lymphs Abs 3.0 0.7 - 4.0 K/uL   Monocytes Relative 6 %   Monocytes Absolute 0.9 0.1 - 1.0 K/uL   Eosinophils Relative 0 %   Eosinophils Absolute 0.0 0.0 - 0.7 K/uL   Basophils Relative 0 %   Basophils Absolute 0.0 0.0 - 0.1 K/uL  Lipase, blood  Result Value Ref Range   Lipase 33 11 - 51 U/L  Comprehensive metabolic panel  Result Value Ref Range   Sodium 140 135 - 145 mmol/L   Potassium 3.0 (L) 3.5 - 5.1 mmol/L   Chloride 100 (L) 101 - 111 mmol/L   CO2 22 22 - 32 mmol/L   Glucose, Bld 176 (H) 65 - 99 mg/dL   BUN 18 6 - 20 mg/dL   Creatinine, Ser 7.821.05 (H) 0.44 - 1.00 mg/dL   Calcium 95.610.4 (H) 8.9 - 10.3 mg/dL   Total Protein 8.3 (H) 6.5 - 8.1 g/dL   Albumin 5.1 (H) 3.5 - 5.0 g/dL   AST 31 15 - 41 U/L   ALT 26 14 - 54 U/L   Alkaline Phosphatase 98 38 - 126 U/L   Total Bilirubin 0.5 0.3 - 1.2 mg/dL   GFR calc non Af Amer >60 >60 mL/min   GFR calc Af Amer >60 >60 mL/min   Anion gap 18 (H) 5 - 15  Urinalysis, Routine w reflex microscopic  Result Value Ref  Range   Color, Urine AMBER (A) YELLOW   APPearance TURBID (A) CLEAR   Specific Gravity, Urine 1.037 (H) 1.005 - 1.030   pH 6.0 5.0 - 8.0   Glucose, UA NEGATIVE NEGATIVE mg/dL   Hgb urine dipstick NEGATIVE NEGATIVE   Bilirubin Urine SMALL (A) NEGATIVE   Ketones, ur >80 (A) NEGATIVE mg/dL   Protein, ur 30 (A) NEGATIVE mg/dL   Nitrite NEGATIVE NEGATIVE   Leukocytes, UA SMALL (A) NEGATIVE  Pregnancy, urine  Result Value Ref Range   Preg Test, Ur NEGATIVE NEGATIVE  Urinalysis, Microscopic (reflex)  Result Value Ref Range   RBC / HPF 0-5 0 - 5 RBC/hpf   WBC, UA 6-30 0 - 5 WBC/hpf   Bacteria, UA MANY (A) NONE SEEN   Squamous Epithelial / LPF TOO NUMEROUS TO COUNT (A) NONE SEEN   Mucous PRESENT    Ca Oxalate Crys, UA PRESENT    Ct Abdomen Pelvis  W Contrast  Result Date: 10/24/2016 CLINICAL DATA:  Left lower quadrant abdominal pain, vomiting and diarrhea for the past 2 days. Previous cholecystectomy and C-section. The C-section was in 2013 and patient had a dilatation and curettage in 2007. EXAM: CT ABDOMEN AND PELVIS WITH CONTRAST TECHNIQUE: Multidetector CT imaging of the abdomen and pelvis was performed using the standard protocol following bolus administration of intravenous contrast. CONTRAST:  100 cc Isovue-300 COMPARISON:  09/22/2004. FINDINGS: Lower chest: Small amount of linear atelectasis or scarring at both lung bases. Hepatobiliary: Interval small cyst in the central liver on the right. There is also interval focal fat deposition in the medial segment of the left lobe of the liver, adjacent to the falciform ligament. Cholecystectomy clips. Pancreas: Unremarkable. No pancreatic ductal dilatation or surrounding inflammatory changes. Spleen: 4 mm cyst or hemangioma in the inferior aspect of the spleen. Adrenals/Urinary Tract: Tiny upper pole left renal calculus. Normal appearing right kidney, ureters, urinary bladder and adrenal glands. Stomach/Bowel: Stomach is within normal limits.  Appendix appears normal. No evidence of bowel wall thickening, distention, or inflammatory changes. Vascular/Lymphatic: No significant vascular findings are present. No enlarged abdominal or pelvic lymph nodes. Reproductive: 1.4 x 1.3 x 0.9 cm oval area of fluid density in the superior aspect of an anteflexed uterus. This would be in the posterior wall of the uterus if uterus was not anteflexed. This is separate from a small amount of fluid in the endometrial canal other than a small curvilinear connection between the two. There is also an incidental 1.5 cm left ovarian follicle. Normal appearing right ovary. Other: There is a small to moderate-sized supraumbilical ventral hernia containing fat. This has 2 components, separated by a thin internal septation. No free peritoneal fluid. Musculoskeletal: Mild lumbar and lower thoracic spine degenerative changes. IMPRESSION: 1. No acute abnormality. 2. Tiny, nonobstructing upper pole left renal calculus. 3. Small to moderate-sized supraumbilical ventral hernia containing fat. 4. 1.4 x 1.3 x 0.9 cm oval area of fluid density in the posterior myometrium of the uterus with a small curvilinear connection to a small amount of fluid within the endometrial cavity. This may represent postprocedural fluid related to the patient's previous C-section or dilatation and curettage. This could also represent a necrotic fibroid. Electronically Signed   By: Beckie SaltsSteven  Reid M.D.   On: 10/24/2016 16:42    CT scan with findings of fluid in the posterior myometrium of the uterus, questionable fluid related to prior C-section versus necrotic fibroid. I feel that these findings are likely incidental and not necessarily the cause of her symptoms today. Her symptoms today seem more related to a viral gastroenteritis. Patient complained of recurrent nausea after drinking the contrast for her CT, Zofran given. She has not had any further vomiting. Feel she is stable for discharge with continued  outpatient care. Encouraged gentle diet, progress back to normal as tolerated, continue oral hydration. Follow-up with PCP.  Discussed plan with patient, she acknowledged understanding and agreed with plan of care.  Return precautions given for new or worsening symptoms.   Garlon HatchetSanders, Shanele Nissan M, PA-C 10/24/16 1820    Vanetta MuldersZackowski, Scott, MD 10/29/16 [id number]

## 2016-10-24 NOTE — ED Provider Notes (Signed)
[facility] Provider Note   CSN: [id number] Arrival date & time: 10/24/16  1217     History   Chief Complaint Chief Complaint  Patient presents with  . Emesis  . Diarrhea    HPI Gwenevere AbbotSusan M Thibodaux is a 41 y.o. female.  HPI   Patient is a 41 year old female with no pertinent past medical history presents to the ED with complaint of vomiting and diarrhea, onset yesterday. Patient reports waking up yesterday and having multiple episodes of nonbloody diarrhea. She states last night around midnight she began having multiple episodes of NBNB vomiting. She also endorses having mild intermittent mid abdominal pain. Denies any aggravating or relieving factors, denies radiation. Patient states she tried taking Pepto-Bismol at home but was unable to keep anything down. She states she has been unable to keep any fluids down since onset of symptoms. Denies any known sick contacts. Denies eating any food concerning for food poisoning. Denies fever, chest pain, shortness of breath, hematemesis, hematochezia, urinary symptoms, flank pain, vaginal bleeding or discharge. LMP 1.5 weeks ago. Patient reports abdominal surgical history of cholecystectomy, ovarian cyst removal, D&C and C-section.  History reviewed. No pertinent past medical history.  There are no active problems to display for this patient.   Past Surgical History:  Procedure Laterality Date  . CESAREAN SECTION  02/20/2012   Procedure: CESAREAN SECTION;  Surgeon: Mickel Baasichard D Kaplan, MD;  Location: [facility];  Service: Obstetrics;  Laterality: N/A;  . CHOLECYSTECTOMY    . DILATION AND CURETTAGE OF UTERUS  2007  . KNEE SURGERY    . OVARIAN CYST REMOVAL    . SHOULDER SURGERY      OB History    Gravida Para Term Preterm AB Living   2 1 0 1 1 1    SAB TAB Ectopic Multiple Live Births   1 0 0 0 1       Home Medications    Prior to Admission medications   Medication Sig Start Date End Date Taking? Authorizing Provider    cephALEXin (KEFLEX) 500 MG capsule Take 1 capsule (500 mg total) by mouth 4 (four) times daily. 02/28/12   Poe, Deirdre C, CNM  cyclobenzaprine (FLEXERIL) 5 MG tablet Take 5 mg by mouth 3 (three) times daily as needed. For back pain.    [provider]  docusate sodium (COLACE) 100 MG capsule Take 1 capsule (100 mg total) by mouth 2 (two) times daily. 02/23/12   Waynard Reedsoss, Kendra, MD  ibuprofen (ADVIL,MOTRIN) 600 MG tablet Take 1 tablet (600 mg total) by mouth every 6 (six) hours as needed for pain. 02/23/12   Waynard Reedsoss, Kendra, MD  oxyCODONE-acetaminophen (PERCOCET/ROXICET) 5-325 MG per tablet Take 1 tablet by mouth every 4 (four) hours as needed for pain. 02/28/12   Poe, Deirdre C, CNM  oxyCODONE-acetaminophen (ROXICET) 5-325 MG per tablet Take 2 tablets by mouth every 4 (four) hours as needed for pain. May take 1-2 tablets every 4-6 hours as needed for pain 02/28/12   Archie PattenFrazier, Natalie K, CNM  Prenatal Vit-Fe Fumarate-FA (PRENATAL MULTIVITAMIN) TABS Take 1 tablet by mouth daily.    [provider]    Family History Family History  Problem Relation Age of Onset  . Cancer Maternal Grandmother        Breast  . Stroke Maternal Grandfather   . Heart disease Paternal Grandmother   . Heart disease Paternal Grandfather     Social History Social History  Substance Use Topics  . Smoking status: Current Every  Day Smoker  . Smokeless tobacco: Never Used  . Alcohol use No     Allergies   Patient has no known allergies.   Review of Systems Review of Systems  Gastrointestinal: Positive for abdominal pain, diarrhea, nausea and vomiting.  All other systems reviewed and are negative.    Physical Exam Updated Vital Signs BP 117/69   Pulse (!) 113   Temp 98.8 F (37.1 C) (Oral)   Resp (!) 22   Ht 5\' 5"  (1.651 m)   Wt 68 kg (150 lb)   LMP 10/17/2016   SpO2 100%   BMI 24.96 kg/m   Physical Exam  Constitutional: She is oriented to person, place, and time. She appears  well-developed and well-nourished. No distress.  HENT:  Head: Normocephalic and atraumatic.  Mouth/Throat: Oropharynx is clear and moist. Mucous membranes are dry. No oropharyngeal exudate. No tonsillar exudate.  Eyes: Conjunctivae and EOM are normal. Right eye exhibits no discharge. Left eye exhibits no discharge. No scleral icterus.  Neck: Normal range of motion. Neck supple.  Cardiovascular: Normal rate, regular rhythm, normal heart sounds and intact distal pulses.   Pulmonary/Chest: Effort normal and breath sounds normal. No respiratory distress. She has no wheezes. She has no rales. She exhibits no tenderness.  Abdominal: Soft. Normal appearance. She exhibits no distension and no mass. There is tenderness. There is no rigidity, no rebound, no guarding and no CVA tenderness. No hernia.  Mild TTP over LLQ and RLQ  Musculoskeletal: Normal range of motion. She exhibits no edema or tenderness.  Neurological: She is alert and oriented to person, place, and time.  Skin: Skin is warm and dry. She is not diaphoretic.  Nursing note and vitals reviewed.    ED Treatments / Results  Labs (all labs ordered are listed, but only abnormal results are displayed) Labs Reviewed  CBC WITH DIFFERENTIAL/PLATELET  LIPASE, BLOOD  COMPREHENSIVE METABOLIC PANEL  URINALYSIS, ROUTINE W REFLEX MICROSCOPIC  HCG, SERUM, QUALITATIVE    EKG  EKG Interpretation None       Radiology No results found.  Procedures Procedures (including critical care time)  Medications Ordered in ED Medications  ondansetron (ZOFRAN) 4 MG/2ML injection (not administered)  sodium chloride 0.9 % bolus 1,000 mL (not administered)     Initial Impression / Assessment and Plan / ED Course  I have reviewed the triage vital signs and the nursing notes.  Pertinent labs & imaging results that were available during my care of the patient were reviewed by me and considered in my medical decision making (see chart for  details).     Patient presents with multiple episodes of nonbloody diarrhea and vomiting that started yesterday morning. Endorses associated pain across her mid/lower abdomen. Denies any known sick contacts. Denies fever, flank pain, urinary symptoms or vaginal bleeding/discharge. VSS. Initial exam revealed dry mucous membranes and mild tenderness over right and left lower abdominal quadrants, no peritoneal signs. Remaining exam unremarkable. Patient given IV fluids and antiemetics.  Pregnancy negative. Labs revealed leukocytosis, WBC 15. K 3, pt given potassium supplement in the ED. Cr 1.05; no prior labs to compare. AG 18. UA showed ketones, small leuks, 6-30 WBCs, TNTC epithelial cells and many bacteria; suspect contamination. Pt given 2nd liter of IVF in the ED. Suspect AG is related to dehydration.    2:20 PM - On reevaluation pt reports having improved nausea but worsening abdominal pain. Reexamination showed TTP over LLQ with mild intermittent guarding. Pt given pain meds, will order CT abdomen  for further evaluation.   [place] Controlled Substance reporting System queried, database showed Xanax 0.25mg  #4 filled on 06/02/16, Xanax 0.25mg  #20 filled on 06/08/16, Norco 5-325mg  #20 filled on 09/13/16 and Norco 5-325mg  #15 filled on 09/19/16.   Hand-off to Sharilyn Sites, PA-C. Dispo pending CT. If imaging negative, plan to d/c pt home with zofran, imodium and bentyl for suspect viral gastroenterititis and PCP f/u as needed.  Final Clinical Impressions(s) / ED Diagnoses   Final diagnoses:  None    New Prescriptions New Prescriptions   No medications on file     Barrett Henle, Cordelia Poche 10/24/16 1624    Eudelia Bunch Amadeo Garnet, MD 10/27/16 [phone]

## 2016-10-24 NOTE — ED Notes (Addendum)
Father of patient came to desk and was concerned about medications given to his daughter.  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  Notified provider.

## 2016-10-24 NOTE — ED Triage Notes (Signed)
Abdominal pain, diarrhea and vomiting since yesterday.

## 2016-12-18 ENCOUNTER — Emergency Department (HOSPITAL_BASED_OUTPATIENT_CLINIC_OR_DEPARTMENT_OTHER): Payer: 59

## 2016-12-18 ENCOUNTER — Encounter (HOSPITAL_BASED_OUTPATIENT_CLINIC_OR_DEPARTMENT_OTHER): Payer: Self-pay

## 2016-12-18 ENCOUNTER — Emergency Department (HOSPITAL_BASED_OUTPATIENT_CLINIC_OR_DEPARTMENT_OTHER)
Admission: EM | Admit: 2016-12-18 | Discharge: 2016-12-18 | Disposition: A | Discharge status: Home / Self Care | Payer: 59 | Attending: Physician Assistant | Admitting: Physician Assistant

## 2016-12-18 DIAGNOSIS — F17200 Nicotine dependence, unspecified, uncomplicated: Secondary | ICD-10-CM | POA: Insufficient documentation

## 2016-12-18 DIAGNOSIS — T148XXA Other injury of unspecified body region, initial encounter: Secondary | ICD-10-CM | POA: Insufficient documentation

## 2016-12-18 DIAGNOSIS — Z79899 Other long term (current) drug therapy: Secondary | ICD-10-CM | POA: Diagnosis not present

## 2016-12-18 DIAGNOSIS — Y939 Activity, unspecified: Secondary | ICD-10-CM | POA: Diagnosis not present

## 2016-12-18 DIAGNOSIS — Y999 Unspecified external cause status: Secondary | ICD-10-CM | POA: Insufficient documentation

## 2016-12-18 DIAGNOSIS — S6992XA Unspecified injury of left wrist, hand and finger(s), initial encounter: Secondary | ICD-10-CM | POA: Diagnosis present

## 2016-12-18 DIAGNOSIS — Y929 Unspecified place or not applicable: Secondary | ICD-10-CM | POA: Insufficient documentation

## 2016-12-18 DIAGNOSIS — S61317A Laceration without foreign body of left little finger with damage to nail, initial encounter: Secondary | ICD-10-CM | POA: Insufficient documentation

## 2016-12-18 DIAGNOSIS — S62639A Displaced fracture of distal phalanx of unspecified finger, initial encounter for closed fracture: Secondary | ICD-10-CM | POA: Insufficient documentation

## 2016-12-18 DIAGNOSIS — W540XXA Bitten by dog, initial encounter: Secondary | ICD-10-CM | POA: Diagnosis not present

## 2016-12-18 MED ORDER — BACITRACIN ZINC 500 UNIT/GM EX OINT
TOPICAL_OINTMENT | Freq: Two times a day (BID) | CUTANEOUS | Status: DC
  Administered 2016-12-18: 16:00:00 via TOPICAL
  Filled 2016-12-18: qty 28.35

## 2016-12-18 MED ORDER — LIDOCAINE HCL (PF) 1 % IJ SOLN: 30.0000 mL | Freq: Once | INTRAMUSCULAR | Status: DC

## 2016-12-18 MED ORDER — AMOXICILLIN-POT CLAVULANATE 875-125 MG PO TABS
1.0000 | ORAL_TABLET | Freq: Once | ORAL | Status: AC
  Administered 2016-12-18: 1 via ORAL
  Filled 2016-12-18: qty 1

## 2016-12-18 MED ORDER — HYDROCODONE-ACETAMINOPHEN 5-325 MG PO TABS: 1.0000 | ORAL_TABLET | Freq: Four times a day (QID) | ORAL | 0 refills | Status: DC | PRN

## 2016-12-18 MED ORDER — HYDROCODONE-ACETAMINOPHEN 5-325 MG PO TABS
1.0000 | ORAL_TABLET | Freq: Once | ORAL | Status: AC
  Administered 2016-12-18: 1 via ORAL
  Filled 2016-12-18: qty 1

## 2016-12-18 MED ORDER — IBUPROFEN 200 MG PO TABS
600.0000 mg | ORAL_TABLET | Freq: Once | ORAL | Status: AC
  Administered 2016-12-18: 600 mg via ORAL
  Filled 2016-12-18: qty 1

## 2016-12-18 MED ORDER — LIDOCAINE HCL 1 % IJ SOLN
INTRAMUSCULAR | Status: AC
  Administered 2016-12-18: 14:00:00
  Filled 2016-12-18: qty 20

## 2016-12-18 MED ORDER — AMOXICILLIN-POT CLAVULANATE 875-125 MG PO TABS: 1.0000 | ORAL_TABLET | Freq: Two times a day (BID) | ORAL | 0 refills | Status: DC

## 2016-12-18 NOTE — ED Notes (Signed)
Tetanus was 4 years ago.

## 2016-12-18 NOTE — ED Provider Notes (Signed)
[facility] Provider Note   CSN: [id number] Arrival date & time: 12/18/16  1150     History   Chief Complaint Chief Complaint  Patient presents with  . Animal Bite    HPI Anna Huang is a 41 y.o. female.  HPI 41 year old female presents to the ED with complaints of dog bites. Patient has several puncture wounds and laceration to her left pinky finger. Patient states that she was attempting to break up her own dogs during a fight when they bit her. Patient is bleeding controlled by EMS. She has not taken anything for the pain prior to arrival. Moving and palpation make the pain worse. The patient states that her tetanus shot is up-to-date last year. She also states that her dog's rabies shots are up-to-date. Patient denies any associated fever, chills, weakness, paresthesias, nausea, emesis. Past Medical History:  Diagnosis Date  . Opioid abuse    Per family    There are no active problems to display for this patient.   Past Surgical History:  Procedure Laterality Date  . CESAREAN SECTION  02/20/2012   Procedure: CESAREAN SECTION;  Surgeon: Mickel Baas, MD;  Location: [facility];  Service: Obstetrics;  Laterality: N/A;  . CHOLECYSTECTOMY    . DILATION AND CURETTAGE OF UTERUS  2007  . KNEE SURGERY    . OVARIAN CYST REMOVAL    . SHOULDER SURGERY      OB History    Gravida Para Term Preterm AB Living   2 1 0 SAB TAB Ectopic Multiple Live Births   1 0 0 0 1       Home Medications    Prior to Admission medications   Medication Sig Start Date End Date Taking? Authorizing Provider  cephALEXin (KEFLEX) 500 MG capsule Take 1 capsule (500 mg total) by mouth 4 (four) times daily. 02/28/12   Poe, Deirdre C, CNM  cyclobenzaprine (FLEXERIL) 5 MG tablet Take 5 mg by mouth 3 (three) times daily as needed. For back pain.    [provider]  dicyclomine (BENTYL) 20 MG tablet Take 1 tablet (20 mg total) by mouth 2 (two) times daily. 10/24/16    Garlon Hatchet, PA-C  docusate sodium (COLACE) 100 MG capsule Take 1 capsule (100 mg total) by mouth 2 (two) times daily. 02/23/12   Waynard Reeds, MD  ibuprofen (ADVIL,MOTRIN) 600 MG tablet Take 1 tablet (600 mg total) by mouth every 6 (six) hours as needed for pain. 02/23/12   Waynard Reeds, MD  ondansetron (ZOFRAN ODT) 4 MG disintegrating tablet Take 1 tablet (4 mg total) by mouth every 8 (eight) hours as needed for nausea. 10/24/16   Garlon Hatchet, PA-C  oxyCODONE-acetaminophen (PERCOCET/ROXICET) 5-325 MG per tablet Take 1 tablet by mouth every 4 (four) hours as needed for pain. 02/28/12   Poe, Deirdre C, CNM  oxyCODONE-acetaminophen (ROXICET) 5-325 MG per tablet Take 2 tablets by mouth every 4 (four) hours as needed for pain. May take 1-2 tablets every 4-6 hours as needed for pain 02/28/12   Archie Patten, CNM  Prenatal Vit-Fe Fumarate-FA (PRENATAL MULTIVITAMIN) TABS Take 1 tablet by mouth daily.    [provider]    Family History Family History  Problem Relation Age of Onset  . Cancer Maternal Grandmother        Breast  . Stroke Maternal Grandfather   . Heart disease Paternal Grandmother   . Heart disease Paternal Grandfather  Social History Social History  Substance Use Topics  . Smoking status: Current Every Day Smoker  . Smokeless tobacco: Never Used  . Alcohol use No     Allergies   Patient has no known allergies.   Review of Systems Review of Systems  Constitutional: Negative for chills and fever.  Gastrointestinal: Negative for nausea and vomiting.  Musculoskeletal: Positive for myalgias.  Skin: Positive for color change and wound.  Neurological: Negative for weakness and numbness.     Physical Exam Updated Vital Signs BP 119/73 (BP Location: Left Arm)   Pulse (!) 102   Resp 18   LMP 12/11/2016   SpO2 100%   Physical Exam  Constitutional: She appears well-developed and well-nourished. No distress.  HENT:  Head: Normocephalic and  atraumatic.  Eyes: Right eye exhibits no discharge. Left eye exhibits no discharge. No scleral icterus.  Neck: Normal range of motion.  Pulmonary/Chest: No respiratory distress.  Musculoskeletal: Normal range of motion.  Patient with full range of motion of the left pinky finger. Cap refill is normal. Sensation intact. Radial pulses are 2+ bilaterally.  Neurological: She is alert.  Skin: Skin is warm and dry. Capillary refill takes less than 2 seconds. No pallor.  Patient with several puncture wounds. 2 puncture wounds to her left forearm with minimal drainage. Wounds are less than 1 cm without any gaping. Patient also has puncture wound to her right forearm and right thumb no gaping. No bleeding. Patient with laceration to the distal tip of her left pinky finger. Distal part of nail is intact to the distal part of the finger however the nail was separated from the nail bed.   Psychiatric: Her behavior is normal. Judgment and thought content normal.  Nursing note and vitals reviewed.          ED Treatments / Results  Labs (all labs ordered are listed, but only abnormal results are displayed) Labs Reviewed - No data to display  EKG  EKG Interpretation None       Radiology Dg Forearm Left  Result Date: 12/18/2016 CLINICAL DATA:  Abrasions from recent dog bite, initial encounter EXAM: LEFT FOREARM - 2 VIEW COMPARISON:  None. FINDINGS: There is no evidence of fracture or other focal bone lesions. Soft tissues are unremarkable. IMPRESSION: No acute abnormality noted. Electronically Signed   By: Alcide Clever M.D.   On: 12/18/2016 13:26   Dg Hand Complete Left  Result Date: 12/18/2016 CLINICAL DATA:  Multiple abrasions due to dog bite, initial encounter EXAM: LEFT HAND - COMPLETE 3+ VIEW COMPARISON:  None. FINDINGS: There is a transverse fracture through the fifth distal phalangeal tuft with mild displacement of fracture fragment. Overlying nail abnormality is noted no other fracture  or dislocation is seen. No radiopaque foreign body is noted. IMPRESSION: Fifth distal phalangeal tuft fracture Electronically Signed   By: Alcide Clever M.D.   On: 12/18/2016 13:23   Dg Hand Complete Right  Result Date: 12/18/2016 CLINICAL DATA:  Multiple lacerations from dog bites, initial encounter EXAM: RIGHT HAND - COMPLETE 3+ VIEW COMPARISON:  None. FINDINGS: There is no evidence of fracture or dislocation. There is no evidence of arthropathy or other focal bone abnormality. Soft tissues are unremarkable. IMPRESSION: No acute abnormality noted. Electronically Signed   By: Alcide Clever M.D.   On: 12/18/2016 13:24    Procedures .Nerve Block Date/Time: 12/19/2016 10:59 AM Performed by: Demetrios Loll T Authorized by: Demetrios Loll T   Consent:    Consent obtained:  Verbal   Consent given by:  Patient   Risks discussed:  Allergic reaction, bleeding, infection, intravenous injection, unsuccessful block, swelling, nerve damage and pain   Alternatives discussed:  No treatment Indications:    Indications:  Pain relief and procedural anesthesia Location:    Body area:  Upper extremity (5th left phanlanx)   Laterality:  Left Pre-procedure details:    Skin preparation:  Povidone-iodine   Preparation: Patient was prepped and draped in usual sterile fashion   Procedure details (see MAR for exact dosages):    Block needle gauge:  27 G   Anesthetic injected:  Lidocaine 1% w/o epi   Injection procedure:  Anatomic landmarks identified, anatomic landmarks palpated, introduced needle, incremental injection and negative aspiration for blood   Paresthesia:  Immediately resolved Post-procedure details:    Outcome:  Anesthesia achieved   Patient tolerance of procedure:  Tolerated well, no immediate complications .Marland KitchenLaceration Repair Date/Time: 12/19/2016 11:00 AM Performed by: Rise Mu Authorized by: Demetrios Loll T   Consent:    Consent given by:  Patient   Risks discussed:   Infection, need for additional repair, nerve damage, poor cosmetic result, pain, poor wound healing, vascular damage and tendon damage   Alternatives discussed:  No treatment Anesthesia (see MAR for exact dosages):    Anesthesia method:  None Laceration details:    Location:  Finger   Finger location:  L small finger   Wound length (cm): tip of finger. Repair type:    Repair type:  Complex Pre-procedure details:    Preparation:  Imaging obtained to evaluate for foreign bodies and patient was prepped and draped in usual sterile fashion Exploration:    Limited defect created (wound extended): yes     Hemostasis achieved with:  Tourniquet and direct pressure   Wound exploration: wound explored through full range of motion and entire depth of wound probed and visualized     Wound extent: underlying fracture     Wound extent: no foreign bodies/material noted     Contaminated: yes   Treatment:    Area cleansed with:  Saline and Betadine   Amount of cleaning:  Extensive   Irrigation solution:  Sterile saline   Irrigation volume:  150   Irrigation method:  Pressure wash   Visualized foreign bodies/material removed: no     Debridement:  Minimal Skin repair:    Repair method:  Sutures   Suture size:  4-0   Suture material:  Prolene   Suture technique:  Simple interrupted   Number of sutures:  3 (3 sutures to stabalize tip of finger. nail was placed back in nail bed and sutured to stay in place.2 loose sutures placed on ulna and radial side of finger ) Approximation:    Approximation:  Loose   Vermilion border: well-aligned   Post-procedure details:    Dressing:  Antibiotic ointment, non-adherent dressing and splint for protection   Patient tolerance of procedure:  Tolerated well, no immediate complications   (including critical care time)  Medications Ordered in ED Medications  ibuprofen (ADVIL,MOTRIN) tablet 600 mg (600 mg Oral Given 12/18/16 1331)  HYDROcodone-acetaminophen  (NORCO/VICODIN) 5-325 MG per tablet 1 tablet (1 tablet Oral Given 12/18/16 1331)  lidocaine (XYLOCAINE) 1 % (with pres) injection (  Given 12/18/16 1333)  amoxicillin-clavulanate (AUGMENTIN) 875-125 MG per tablet 1 tablet (1 tablet Oral Given 12/18/16 1545)     Initial Impression / Assessment and Plan / ED Course  I have reviewed the triage vital signs  and the nursing notes.  Pertinent labs & imaging results that were available during my care of the patient were reviewed by me and considered in my medical decision making (see chart for details).     Patient presented to the emergency Department today with complaints of animal bite. Patient with several puncture wounds and a laceration to the left little finger with nailbed involvement. Wounds were extensively cleaned and irrigated. Imaging was obtained prior to suturing. No retained foreign bodies. Of note patient does have a distal tuft fracture of the left pinky finger. The puncture wounds were not gaping enough to need to close. These were cleaned and dressed with antibiotic ointment and dressings. The laceration to the left pinky finger and fracture was discussed with Dr. Lacey Jensen with hand surgery who recommends stabilizing the tip of the finger with sutures and splinting and will see patient in the ofice. Procedure was discussed with Dr. Juliann Pares my attending who helped supervise. The nail was placed back into the nail bed. It was held in place with a suture. The laceration was repaired with 2 sutures on the ulnar radial side of the pinky finger to stabilize the tip of finger. This laceration was irrigated with copious amounts of saline and cleaned with Betadine. The wound was dressed with bulky dressing and placed into a immobilizer. Patient states that her tetanus shot is up-to-date. Patient states that the dog's rabies shots are up-to-date. There are the patient's dogs. Patient remains neurovascularly intact in all extremities. Patient needs close  follow-up with her primary care doctor in the hand doctor. She was placed on Augmentin and given small dose of pain medicine. Was discussed with patient to watch closely for signs of infection.  Pt is hemodynamically stable, in NAD, & able to ambulate in the ED. Evaluation does not show pathology that would require ongoing emergent intervention or inpatient treatment. I explained the diagnosis to the patient. Pain has been managed & has no complaints prior to dc. Pt is comfortable with above plan and is stable for discharge at this time. All questions were answered prior to disposition. Strict return precautions for f/u to the ED were discussed. Encouraged follow up with PCP.   Final Clinical Impressions(s) / ED Diagnoses   Final diagnoses:  Animal bite  Closed fracture of tuft of distal phalanx of finger  Laceration of left little finger without foreign body with damage to nail, initial encounter  Puncture wound    New Prescriptions Discharge Medication List as of 12/18/2016  3:39 PM    START taking these medications   Details  amoxicillin-clavulanate (AUGMENTIN) 875-125 MG tablet Take 1 tablet by mouth every 12 (twelve) hours., Starting Sun 12/18/2016, Print         Rise Mu, PA-C 12/19/16 1106    Mackuen, Cindee Salt, MD 12/21/16 [phone]

## 2016-12-18 NOTE — Discharge Instructions (Signed)
Take antibiotics as prescribed. Motrin for pain. Wear the splint. Watch for signs of infection. Please call hand tomorrow and follow up this am. If you develop purulent drainage, fevers, redness return to the ED.

## 2016-12-18 NOTE — ED Triage Notes (Signed)
Pt brought in by EMS for dog bite. Pt was bit by her own dogs while attempting to break up a fight. [facility] has been dispatched and will be arriving shortly. Pt with numerous puncture wounds to bilateral arms, fingers and hands.

## 2016-12-18 NOTE — ED Notes (Signed)
ED Provider at bedside. 

## 2016-12-18 NOTE — ED Notes (Signed)
Patient stated that her dogs were fighting and she stuck her arm to her dog's mouth to stopped the fight.  She has several puncture wounds: 2 puncture wounds to her left forearm and an open wound to her left pinky with nail almost to come out,  abrasion to left posterior wrist, 1 puncture wound to her right posterior forearm and an open wound to her right thumb.

## 2016-12-18 NOTE — ED Notes (Signed)
Suture cart at bedside 

## 2017-07-17 ENCOUNTER — Encounter (HOSPITAL_BASED_OUTPATIENT_CLINIC_OR_DEPARTMENT_OTHER): Payer: Self-pay | Admitting: Emergency Medicine

## 2017-07-17 ENCOUNTER — Emergency Department (HOSPITAL_BASED_OUTPATIENT_CLINIC_OR_DEPARTMENT_OTHER)
Admission: EM | Admit: 2017-07-17 | Discharge: 2017-07-18 | Disposition: A | Discharge status: Home / Self Care | Payer: 59 | Attending: Emergency Medicine | Admitting: Emergency Medicine

## 2017-07-17 ENCOUNTER — Other Ambulatory Visit: Payer: Self-pay

## 2017-07-17 DIAGNOSIS — S61212A Laceration without foreign body of right middle finger without damage to nail, initial encounter: Secondary | ICD-10-CM | POA: Diagnosis present

## 2017-07-17 DIAGNOSIS — Y929 Unspecified place or not applicable: Secondary | ICD-10-CM | POA: Insufficient documentation

## 2017-07-17 DIAGNOSIS — Z79899 Other long term (current) drug therapy: Secondary | ICD-10-CM | POA: Diagnosis not present

## 2017-07-17 DIAGNOSIS — S80212A Abrasion, left knee, initial encounter: Secondary | ICD-10-CM

## 2017-07-17 DIAGNOSIS — Z23 Encounter for immunization: Secondary | ICD-10-CM | POA: Insufficient documentation

## 2017-07-17 DIAGNOSIS — Y93G1 Activity, food preparation and clean up: Secondary | ICD-10-CM | POA: Insufficient documentation

## 2017-07-17 DIAGNOSIS — W260XXA Contact with knife, initial encounter: Secondary | ICD-10-CM | POA: Diagnosis not present

## 2017-07-17 DIAGNOSIS — Y999 Unspecified external cause status: Secondary | ICD-10-CM | POA: Insufficient documentation

## 2017-07-17 DIAGNOSIS — S80211A Abrasion, right knee, initial encounter: Secondary | ICD-10-CM

## 2017-07-17 DIAGNOSIS — F17210 Nicotine dependence, cigarettes, uncomplicated: Secondary | ICD-10-CM | POA: Diagnosis not present

## 2017-07-17 DIAGNOSIS — S61213A Laceration without foreign body of left middle finger without damage to nail, initial encounter: Secondary | ICD-10-CM

## 2017-07-17 HISTORY — DX: Anxiety disorder, unspecified: F41.9

## 2017-07-17 MED ORDER — ACETAMINOPHEN 500 MG PO TABS
1000.0000 mg | ORAL_TABLET | Freq: Once | ORAL | Status: AC
  Administered 2017-07-18: 1000 mg via ORAL
  Filled 2017-07-17: qty 2

## 2017-07-17 MED ORDER — TETANUS-DIPHTH-ACELL PERTUSSIS 5-2.5-18.5 LF-MCG/0.5 IM SUSP
0.5000 mL | Freq: Once | INTRAMUSCULAR | Status: AC
  Administered 2017-07-17: 0.5 mL via INTRAMUSCULAR
  Filled 2017-07-17: qty 0.5

## 2017-07-17 MED ORDER — LIDOCAINE HCL (PF) 1 % IJ SOLN
10.0000 mL | Freq: Once | INTRAMUSCULAR | Status: AC
  Administered 2017-07-17: 10 mL via INTRADERMAL
  Filled 2017-07-17: qty 10

## 2017-07-17 NOTE — ED Triage Notes (Addendum)
States was cutting fruit and cut her middle finger right hand, bleeding controlled. Sleepy, states just woke up and not driving . Circular lac noted, CMS intact , drsg applied . Assisted to W/C and instructed not to get up without assistance. Father at side in waiting room

## 2017-07-17 NOTE — ED Provider Notes (Signed)
TIME SEEN: 11:21 PM  CHIEF COMPLAINT: left third finger laceration  HPI: Patient is a 42 year old female with history of opioid abuse, anxiety who presents to the emergency department the left third finger laceration that occurred just prior to arrival.  She is right-hand dominant.  States she cut her finger with a knife when she was cutting fruit with her daughter.  She states that she got into a panic and tripped and fell and scraped both of her knees.  No head injury.  Denies any other injury.  States her last tetanus vaccination was approximately 6 years ago.  Her father is with her informed nursing staff that she is drug-seeking.  ROS: See HPI Constitutional: no fever  Eyes: no drainage  ENT: no runny nose   Cardiovascular:  no chest pain  Resp: no SOB  GI: no vomiting GU: no dysuria Integumentary: no rash  Allergy: no hives  Musculoskeletal: no leg swelling  Neurological: no slurred speech ROS otherwise negative  PAST MEDICAL HISTORY/PAST SURGICAL HISTORY:  Past Medical History:  Diagnosis Date  . Anxiety   . Opioid abuse ([facility])    Per family    MEDICATIONS:  Prior to Admission medications   Medication Sig Start Date End Date Taking? Authorizing Provider  ALPRAZolam (XANAX) 0.25 MG tablet Take 0.25 mg by mouth 3 (three) times daily as needed for anxiety.   Yes [provider]  amoxicillin-clavulanate (AUGMENTIN) 875-125 MG tablet Take 1 tablet by mouth every 12 (twelve) hours. 12/18/16   Rise MuLeaphart, Kenneth T, PA-C  cephALEXin (KEFLEX) 500 MG capsule Take 1 capsule (500 mg total) by mouth 4 (four) times daily. 02/28/12   Poe, Deirdre C, CNM  cyclobenzaprine (FLEXERIL) 5 MG tablet Take 5 mg by mouth 3 (three) times daily as needed. For back pain.    [provider]  dicyclomine (BENTYL) 20 MG tablet Take 1 tablet (20 mg total) by mouth 2 (two) times daily. 10/24/16   Garlon HatchetSanders, Lisa M, PA-C  docusate sodium (COLACE) 100 MG capsule Take 1 capsule (100 mg total) by  mouth 2 (two) times daily. 02/23/12   Waynard Reedsoss, Kendra, MD  HYDROcodone-acetaminophen (NORCO) 5-325 MG tablet Take 1 tablet by mouth every 6 (six) hours as needed for moderate pain. 12/18/16   Rise MuLeaphart, Kenneth T, PA-C  ibuprofen (ADVIL,MOTRIN) 600 MG tablet Take 1 tablet (600 mg total) by mouth every 6 (six) hours as needed for pain. 02/23/12   Waynard Reedsoss, Kendra, MD  ondansetron (ZOFRAN ODT) 4 MG disintegrating tablet Take 1 tablet (4 mg total) by mouth every 8 (eight) hours as needed for nausea. 10/24/16   Garlon HatchetSanders, Lisa M, PA-C  oxyCODONE-acetaminophen (PERCOCET/ROXICET) 5-325 MG per tablet Take 1 tablet by mouth every 4 (four) hours as needed for pain. 02/28/12   Poe, Deirdre C, CNM  oxyCODONE-acetaminophen (ROXICET) 5-325 MG per tablet Take 2 tablets by mouth every 4 (four) hours as needed for pain. May take 1-2 tablets every 4-6 hours as needed for pain 02/28/12   Archie PattenFrazier, Natalie K, CNM  Prenatal Vit-Fe Fumarate-FA (PRENATAL MULTIVITAMIN) TABS Take 1 tablet by mouth daily.    [provider]    ALLERGIES:  No Known Allergies  SOCIAL HISTORY:  Social History   Tobacco Use  . Smoking status: Current Every Day Smoker    Types: Cigarettes  . Smokeless tobacco: Never Used  Substance Use Topics  . Alcohol use: No    FAMILY HISTORY: Family History  Problem Relation Age of Onset  . Cancer Maternal Grandmother  Breast  . Stroke Maternal Grandfather   . Heart disease Paternal Grandmother   . Heart disease Paternal Grandfather     EXAM: BP (!) 101/54 (BP Location: Left Arm)   Pulse 96   Temp 98.5 F (36.9 C) (Oral)   Resp 18   Ht 5\' 5"  (1.651 m)   Wt 70.3 kg (155 lb)   SpO2 100%   BMI 25.79 kg/m  CONSTITUTIONAL: Alert and oriented and responds appropriately to questions. Well-appearing; well-nourished, appears very intoxicated and is falling asleep during questioning HEAD: Normocephalic EYES: Conjunctivae clear, pupils appear equal, EOMI ENT: normal nose; moist mucous  membranes NECK: Supple, no meningismus, no nuchal rigidity, no LAD  CARD: RRR; S1 and S2 appreciated; no murmurs, no clicks, no rubs, no gallops RESP: Normal chest excursion without splinting or tachypnea; breath sounds clear and equal bilaterally; no wheezes, no rhonchi, no rales, no hypoxia or respiratory distress, speaking full sentences ABD/GI: Normal bowel sounds; non-distended; soft, non-tender, no rebound, no guarding, no peritoneal signs, no hepatosplenomegaly BACK:  The back appears normal and is non-tender to palpation, there is no CVA tenderness EXT: Almost circumferential laceration to the distal end of the third left finger with no obvious tendon involvement with full range of motion in that finger normal capillary refill, no foreign body appreciated, normal ROM in all joints; abrasions to bilateral knees but no bony tenderness or bony deformity or joint effusion, otherwise extremities are non-tender to palpation; no edema; normal capillary refill; no cyanosis, no calf tenderness or swelling    SKIN: Normal color for age and race; warm; no rash NEURO: Moves all extremities equally PSYCH: The patient's mood and manner are appropriate. Grooming and personal hygiene are appropriate.  MEDICAL DECISION MAKING: Patient here with finger laceration.  Will update tetanus vaccination.  Will perform nerve block and laceration repair.  I feel patient will need to be monitored until clinically sober so I can evaluate her for any other potential injury given she fell.  Patient reports she is comfortable with this plan.  ED PROGRESS: Laceration repaired by Harolyn Rutherford PA.  Will dress wound.  She is now more awake.  No other sign of trauma on exam.  Denies head injury.  Neck or back tenderness or midline step-off or deformity.  Neurologically intact.  Hemodynamically stable.  She will be discharged with her father.   At this time, I do not feel there is any life-threatening condition present. I have  reviewed and discussed all results (EKG, imaging, lab, urine as appropriate) and exam findings with patient/family. I have reviewed nursing notes and appropriate previous records.  I feel the patient is safe to be discharged home without further emergent workup and can continue workup as an outpatient as needed. Discussed usual and customary return precautions. Patient/family verbalize understanding and are comfortable with this plan.  Outpatient follow-up has been provided if needed. All questions have been answered.         Damean Poffenberger, Layla Maw, DO 07/18/17 [phone]

## 2017-07-18 MED ORDER — BUPIVACAINE HCL (PF) 0.5 % IJ SOLN
10.0000 mL | Freq: Once | INTRAMUSCULAR | Status: DC
  Filled 2017-07-18: qty 10

## 2017-07-18 NOTE — ED Provider Notes (Signed)
 .  Nerve Block Date/Time: 07/18/2017 1:01 AM Performed by: Anselm PancoastJoy, Nicoli Nardozzi C, PA-C Authorized by: Anselm PancoastJoy, Tricha Ruggirello C, PA-C   Consent:    Consent obtained:  Verbal   Consent given by:  Patient   Risks discussed:  Swelling, unsuccessful block and pain Indications:    Indications:  Procedural anesthesia Location:    Body area:  Upper extremity   Upper extremity nerve blocked: Digital, right middle finger.   Laterality:  Right Pre-procedure details:    Skin preparation:  Povidone-iodine Procedure details (see MAR for exact dosages):    Block needle gauge:  25 G   Anesthetic injected:  Bupivacaine 0.5% w/o epi and lidocaine 1% w/o epi   Injection procedure:  Anatomic landmarks identified, anatomic landmarks palpated, incremental injection, introduced needle and negative aspiration for blood Post-procedure details:    Outcome:  Anesthesia achieved   Patient tolerance of procedure:  Tolerated well, no immediate complications .Marland Kitchen.Laceration Repair Date/Time: 07/18/2017 1:08 AM Performed by: Anselm PancoastJoy, Janai Maudlin C, PA-C Authorized by: Anselm PancoastJoy, Charmion Hapke C, PA-C   Consent:    Consent obtained:  Verbal   Consent given by:  Patient   Risks discussed:  Pain, poor cosmetic result, poor wound healing, need for additional repair and retained foreign body Anesthesia (see MAR for exact dosages):    Anesthesia method:  Nerve block   Block location:  Digital   Block needle gauge:  25 G   Block anesthetic:  Bupivacaine 0.5% w/o epi and lidocaine 1% w/o epi   Block injection procedure:  Anatomic landmarks identified, anatomic landmarks palpated, introduced needle, negative aspiration for blood and incremental injection   Block outcome:  Anesthesia achieved Laceration details:    Location:  Finger   Finger location:  R long finger   Length (cm):  3 Repair type:    Repair type:  Simple Pre-procedure details:    Preparation:  Patient was prepped and draped in usual sterile fashion Exploration:    Hemostasis achieved with:   Direct pressure   Wound exploration: wound explored through full range of motion   Treatment:    Area cleansed with:  Betadine and saline   Amount of cleaning:  Standard   Irrigation solution:  Sterile saline   Irrigation method:  Syringe Skin repair:    Repair method:  Sutures   Suture size:  5-0   Suture material:  Nylon   Suture technique:  Simple interrupted   Number of sutures:  11 Approximation:    Approximation:  Close Post-procedure details:    Dressing:  Non-adherent dressing, antibiotic ointment and sterile dressing   Patient tolerance of procedure:  Tolerated well, no immediate complications                 Anselm PancoastJoy, Craig Ionescu C, PA-C 07/18/17 0141

## 2017-07-18 NOTE — Discharge Instructions (Addendum)
You may alternate Tylenol 1000 mg every 6 hours as needed for pain and Ibuprofen 800 mg every 8 hours as needed for pain.  Please take Ibuprofen with food. ° °

## 2017-07-18 NOTE — ED Notes (Signed)
MD is aware of BP. Nurse is suppose to call father to pick patient up.

## 2017-07-26 ENCOUNTER — Emergency Department (HOSPITAL_BASED_OUTPATIENT_CLINIC_OR_DEPARTMENT_OTHER)
Admission: EM | Admit: 2017-07-26 | Discharge: 2017-07-26 | Disposition: A | Discharge status: Home / Self Care | Payer: 59 | Attending: Emergency Medicine | Admitting: Emergency Medicine

## 2017-07-26 ENCOUNTER — Other Ambulatory Visit: Payer: Self-pay

## 2017-07-26 ENCOUNTER — Encounter (HOSPITAL_BASED_OUTPATIENT_CLINIC_OR_DEPARTMENT_OTHER): Payer: Self-pay | Admitting: *Deleted

## 2017-07-26 DIAGNOSIS — Z79899 Other long term (current) drug therapy: Secondary | ICD-10-CM | POA: Diagnosis not present

## 2017-07-26 DIAGNOSIS — W260XXD Contact with knife, subsequent encounter: Secondary | ICD-10-CM | POA: Diagnosis not present

## 2017-07-26 DIAGNOSIS — S61214D Laceration without foreign body of right ring finger without damage to nail, subsequent encounter: Secondary | ICD-10-CM | POA: Insufficient documentation

## 2017-07-26 DIAGNOSIS — Z4802 Encounter for removal of sutures: Secondary | ICD-10-CM | POA: Insufficient documentation

## 2017-07-26 DIAGNOSIS — F17210 Nicotine dependence, cigarettes, uncomplicated: Secondary | ICD-10-CM | POA: Diagnosis not present

## 2017-07-26 NOTE — ED Provider Notes (Signed)
[facility] EMERGENCY DEPARTMENT Provider Note   CSN: [id number] Arrival date & time: 07/26/17  1312     History   Chief Complaint Chief Complaint  Patient presents with  . Suture / Staple Removal    HPI Anna Huang is a 42 y.o. female with a hx of anxiety who presents to the ED for suture removal today after placement 04/23. She was seen in the ED after cutting her R 3rd finger with a knife while cutting fuit. Tetanus was updated. She had a digital block with subsequent placement of 11 sutures into the wound. She states that she had some redness and swelling that prompted an urgent care visit last week- she was started on augmentin which she took with resolution of the erythema/swelling. Has not had problems since. States the area is sore, worse with palpation. No other alleviating/aggravating factors. Denies fever.    HPI  Past Medical History:  Diagnosis Date  . Anxiety   . Opioid abuse (HCC)    Per family    There are no active problems to display for this patient.   Past Surgical History:  Procedure Laterality Date  . CESAREAN SECTION  02/20/2012   Procedure: CESAREAN SECTION;  Surgeon: Mickel Baas, MD;  Location: [facility];  Service: Obstetrics;  Laterality: N/A;  . CHOLECYSTECTOMY    . DILATION AND CURETTAGE OF UTERUS  2007  . KNEE SURGERY    . OVARIAN CYST REMOVAL    . SHOULDER SURGERY       OB History    Gravida  2   Para  1   Term  0   Preterm  1   AB  1   Living  1     SAB  1   TAB  0   Ectopic  0   Multiple  0   Live Births  1            Home Medications    Prior to Admission medications   Medication Sig Start Date End Date Taking? Authorizing Provider  amoxicillin-clavulanate (AUGMENTIN) 875-125 MG tablet Take 1 tablet by mouth 2 (two) times daily.   Yes [provider]  ALPRAZolam (XANAX) 0.25 MG tablet Take 0.25 mg by mouth 3 (three) times daily as needed for anxiety.    [provider]    cyclobenzaprine (FLEXERIL) 5 MG tablet Take 5 mg by mouth 3 (three) times daily as needed. For back pain.    [provider]  dicyclomine (BENTYL) 20 MG tablet Take 1 tablet (20 mg total) by mouth 2 (two) times daily. 10/24/16   Garlon Hatchet, PA-C  docusate sodium (COLACE) 100 MG capsule Take 1 capsule (100 mg total) by mouth 2 (two) times daily. 02/23/12   Waynard Reeds, MD  HYDROcodone-acetaminophen (NORCO) 5-325 MG tablet Take 1 tablet by mouth every 6 (six) hours as needed for moderate pain. 12/18/16   Rise Mu, PA-C  ibuprofen (ADVIL,MOTRIN) 600 MG tablet Take 1 tablet (600 mg total) by mouth every 6 (six) hours as needed for pain. 02/23/12   Waynard Reeds, MD  ondansetron (ZOFRAN ODT) 4 MG disintegrating tablet Take 1 tablet (4 mg total) by mouth every 8 (eight) hours as needed for nausea. 10/24/16   Garlon Hatchet, PA-C  oxyCODONE-acetaminophen (PERCOCET/ROXICET) 5-325 MG per tablet Take 1 tablet by mouth every 4 (four) hours as needed for pain. 02/28/12   Poe, Deirdre C, CNM  oxyCODONE-acetaminophen (ROXICET) 5-325 MG per tablet Take  2 tablets by mouth every 4 (four) hours as needed for pain. May take 1-2 tablets every 4-6 hours as needed for pain 02/28/12   Archie Patten, CNM  Prenatal Vit-Fe Fumarate-FA (PRENATAL MULTIVITAMIN) TABS Take 1 tablet by mouth daily.    [provider]    Family History Family History  Problem Relation Age of Onset  . Cancer Maternal Grandmother        Breast  . Stroke Maternal Grandfather   . Heart disease Paternal Grandmother   . Heart disease Paternal Grandfather     Social History Social History   Tobacco Use  . Smoking status: Current Every Day Smoker    Types: Cigarettes  . Smokeless tobacco: Never Used  Substance Use Topics  . Alcohol use: No  . Drug use: No     Allergies   Patient has no known allergies.   Review of Systems Review of Systems  Constitutional: Negative for chills and fever.  Skin:  Positive for wound.     Physical Exam Updated Vital Signs BP 121/84   Pulse 92   Temp 98.1 F (36.7 C) (Oral)   Resp 16   Ht  (1.651 m)   Wt 70.3 kg (155 lb)   SpO2 100%   BMI 25.79 kg/m   Physical Exam  Constitutional: She appears well-developed and well-nourished. No distress.  HENT:  Head: Normocephalic and atraumatic.  Eyes: Conjunctivae are normal. Right eye exhibits no discharge. Left eye exhibits no discharge.  Cardiovascular:  Pulses:      Radial pulses are 2+ on the right side, and 2+ on the left side.  Neurological: She is alert.  Clear speech. Sensation grossly intact to bilateral upper extremities. 5/5 grip strength. Able to perform OK sign, cross 2nd/3rd digits, and thumbs up.   Skin: Capillary refill takes less than 2 seconds.  Healing laceration to the palmar surface of the R 3rd digit just distal to the DIP joint with 11 sutures in place. There is some surrounding ecchymosis. No surrounding erythema or warmth. No discharge from the wound. The area is slightly tender to palpation. Patient has normal ROM at all MCP and PIP joints, R 3rd digit DIP is limited with flexion slightly secondary to discomfort.   Psychiatric: She has a normal mood and affect. Her behavior is normal. Thought content normal.  Nursing note and vitals reviewed.  ED Treatments / Results  Labs (all labs ordered are listed, but only abnormal results are displayed) Labs Reviewed - No data to display  EKG None  Radiology No results found.  Procedures .Suture Removal Date/Time: 07/26/2017 2:53 PM Performed by: Cherly Anderson, PA-C Authorized by: Cherly Anderson, PA-C   Consent:    Consent obtained:  Verbal   Consent given by:  Patient   Risks discussed:  Bleeding, pain and wound separation   Alternatives discussed:  No treatment Location:    Location:  Upper extremity   Upper extremity location:  Hand   Hand location:  R long finger Procedure details:    Wound  appearance:  No signs of infection   Number of sutures removed:  11 Post-procedure details:    Post-removal:  Antibiotic ointment applied and Band-Aid applied   Patient tolerance of procedure:  Tolerated well, no immediate complications   (including critical care time)  Medications Ordered in ED Medications - No data to display   Initial Impression / Assessment and Plan / ED Course  I have reviewed the triage vital  signs and the nursing notes.  Pertinent labs & imaging results that were available during my care of the patient were reviewed by me and considered in my medical decision making (see chart for details).     Patient presents for suture removal and wound check. Procedure per above, tolerated well. Patient did report signs of infection last week which she took an abx for, no signs of infection today. Scar minimization and return precautions given.   Final Clinical Impressions(s) / ED Diagnoses   Final diagnoses:  Visit for suture removal    ED Discharge Orders    None       Cherly Anderson, PA-C 07/26/17 1809    Jacalyn Lefevre, MD 07/27/17 [phone]

## 2017-07-26 NOTE — ED Triage Notes (Signed)
Here for  suture removal placed 4/22

## 2017-07-26 NOTE — Discharge Instructions (Addendum)
You were seen in the emergency department today and had stitches removed. Please see the attached hand out regarding instructions.   As discussed it is important that you have a primary care provider. Please call the phone number circled in your discharge instructions to set up primary care.   Return to the ER for any new or worsening symptoms including, but not limited to fever, chills, redness at the wound site, discharge from the wound, re-opening of the wound, or any other concerns.

## 2018-06-02 ENCOUNTER — Emergency Department (HOSPITAL_BASED_OUTPATIENT_CLINIC_OR_DEPARTMENT_OTHER): Payer: 59

## 2018-06-02 ENCOUNTER — Emergency Department (HOSPITAL_BASED_OUTPATIENT_CLINIC_OR_DEPARTMENT_OTHER)
Admission: EM | Admit: 2018-06-02 | Discharge: 2018-06-02 | Disposition: A | Discharge status: Home / Self Care | Payer: 59 | Attending: Emergency Medicine | Admitting: Emergency Medicine

## 2018-06-02 ENCOUNTER — Other Ambulatory Visit: Payer: Self-pay

## 2018-06-02 ENCOUNTER — Encounter (HOSPITAL_BASED_OUTPATIENT_CLINIC_OR_DEPARTMENT_OTHER): Payer: Self-pay | Admitting: *Deleted

## 2018-06-02 DIAGNOSIS — M545 Low back pain, unspecified: Secondary | ICD-10-CM

## 2018-06-02 DIAGNOSIS — M5489 Other dorsalgia: Secondary | ICD-10-CM | POA: Diagnosis present

## 2018-06-02 DIAGNOSIS — R109 Unspecified abdominal pain: Secondary | ICD-10-CM

## 2018-06-02 DIAGNOSIS — F17210 Nicotine dependence, cigarettes, uncomplicated: Secondary | ICD-10-CM | POA: Diagnosis not present

## 2018-06-02 DIAGNOSIS — Z79899 Other long term (current) drug therapy: Secondary | ICD-10-CM | POA: Insufficient documentation

## 2018-06-02 DIAGNOSIS — R1032 Left lower quadrant pain: Secondary | ICD-10-CM | POA: Insufficient documentation

## 2018-06-02 HISTORY — DX: Noninfective gastroenteritis and colitis, unspecified: K52.9

## 2018-06-02 HISTORY — DX: Calculus of kidney: N20.0

## 2018-06-02 LAB — URINALYSIS, ROUTINE W REFLEX MICROSCOPIC
Bilirubin Urine: NEGATIVE
Glucose, UA: NEGATIVE mg/dL
Hgb urine dipstick: NEGATIVE
Ketones, ur: NEGATIVE mg/dL
Leukocytes,Ua: NEGATIVE
NITRITE: NEGATIVE
Protein, ur: NEGATIVE mg/dL
SPECIFIC GRAVITY, URINE: 1.02 (ref 1.005–1.030)
pH: 6.5 (ref 5.0–8.0)

## 2018-06-02 LAB — BASIC METABOLIC PANEL
Anion gap: 6 (ref 5–15)
BUN: 11 mg/dL (ref 6–20)
CO2: 23 mmol/L (ref 22–32)
Calcium: 8.8 mg/dL — ABNORMAL LOW (ref 8.9–10.3)
Chloride: 106 mmol/L (ref 98–111)
Creatinine, Ser: 0.74 mg/dL (ref 0.44–1.00)
GFR calc Af Amer: >60 mL/min (ref >60)
GFR calc non Af Amer: >60 mL/min (ref >60)
Glucose, Bld: 88 mg/dL (ref 70–99)
Potassium: 3.6 mmol/L (ref 3.5–5.1)
Sodium: 135 mmol/L (ref 135–145)

## 2018-06-02 LAB — CBC WITH DIFFERENTIAL/PLATELET
Abs Immature Granulocytes: 0.01 10*3/uL (ref 0.00–0.07)
Basophils Absolute: 0.1 10*3/uL (ref 0.0–0.1)
Basophils Relative: 1 %
Eosinophils Absolute: 0.3 10*3/uL (ref 0.0–0.5)
Eosinophils Relative: 3 %
HCT: 43.4 % (ref 36.0–46.0)
Hemoglobin: 14.6 g/dL (ref 12.0–15.0)
Immature Granulocytes: 0 %
Lymphocytes Relative: 40 %
Lymphs Abs: 3.5 10*3/uL (ref 0.7–4.0)
MCH: 30.2 pg (ref 26.0–34.0)
MCHC: 33.6 g/dL (ref 30.0–36.0)
MCV: 89.7 fL (ref 80.0–100.0)
Monocytes Absolute: 0.6 10*3/uL (ref 0.1–1.0)
Monocytes Relative: 7 %
Neutro Abs: 4.3 10*3/uL (ref 1.7–7.7)
Neutrophils Relative %: 49 %
Platelets: 312 10*3/uL (ref 150–400)
RBC: 4.84 MIL/uL (ref 3.87–5.11)
RDW: 12.4 % (ref 11.5–15.5)
WBC: 8.8 10*3/uL (ref 4.0–10.5)
nRBC: 0 % (ref 0.0–0.2)

## 2018-06-02 MED ORDER — TRAMADOL HCL 50 MG PO TABS: 50.0000 mg | ORAL_TABLET | Freq: Four times a day (QID) | ORAL | 0 refills | Status: DC | PRN

## 2018-06-02 MED ORDER — IBUPROFEN 800 MG PO TABS: 800.0000 mg | ORAL_TABLET | Freq: Three times a day (TID) | ORAL | 0 refills | Status: DC | PRN

## 2018-06-02 MED ORDER — KETOROLAC TROMETHAMINE 30 MG/ML IJ SOLN
30.0000 mg | Freq: Once | INTRAMUSCULAR | Status: AC
  Administered 2018-06-02: 30 mg via INTRAVENOUS
  Filled 2018-06-02: qty 1

## 2018-06-02 MED ORDER — HYDROCODONE-ACETAMINOPHEN 5-325 MG PO TABS: 1.0000 | ORAL_TABLET | Freq: Four times a day (QID) | ORAL | 0 refills | Status: DC | PRN

## 2018-06-02 MED ORDER — ONDANSETRON HCL 4 MG/2ML IJ SOLN
4.0000 mg | Freq: Once | INTRAMUSCULAR | Status: AC
  Administered 2018-06-02: 4 mg via INTRAVENOUS
  Filled 2018-06-02: qty 2

## 2018-06-02 MED ORDER — MORPHINE SULFATE (PF) 4 MG/ML IV SOLN
4.0000 mg | Freq: Once | INTRAVENOUS | Status: AC
  Administered 2018-06-02: 4 mg via INTRAVENOUS
  Filled 2018-06-02: qty 1

## 2018-06-02 NOTE — ED Triage Notes (Signed)
Pt reports left flank pain today which started after she stood up from sitting in a movie theatre. Pt has hx of kidney stones and states this feels similar. Also states pain worsens with certain movements, like sitting in car and triage chair

## 2018-06-02 NOTE — Discharge Instructions (Signed)
Return here as needed.  Follow-up with your primary doctor.  Use ice and heat on the area that is sore. °

## 2018-06-02 NOTE — ED Notes (Signed)
Pt states she had kidney stone in November and had stent placed.

## 2018-06-02 NOTE — ED Notes (Signed)
Patient transported to CT 

## 2018-06-05 NOTE — ED Provider Notes (Signed)
[facility] EMERGENCY DEPARTMENT Provider Note   CSN: [id number] Arrival date & time: 06/02/18  1853    History   Chief Complaint Chief Complaint  Patient presents with  . Back Pain    HPI Anna Huang is a 43 y.o. female.     HPI Patient presents to the emergency department with left flank and back pain that started when she stood up in the movie theater.  The patient states she had severe pain she states that she had drank a lot of drink at the movies and she said she went down to the bathroom and urinated a large amount of urine.  The patient states that pain continued and that is what brought her to the emergency department.  The patient states that nothing she is regular condition better or worse.  Patient states she did not take any medications prior to arrival.  The patient denies chest pain, shortness of breath, headache,blurred vision, neck pain, fever, cough, weakness, numbness, dizziness, anorexia, edema,nausea, vomiting, diarrhea, rash, back pain, dysuria, hematemesis, bloody stool, near syncope, or syncope. Past Medical History:  Diagnosis Date  . Anxiety   . Colitis   . Kidney stone   . Opioid abuse (HCC)    Per family    There are no active problems to display for this patient.   Past Surgical History:  Procedure Laterality Date  . ABDOMINAL HYSTERECTOMY    . CESAREAN SECTION  02/20/2012   Procedure: CESAREAN SECTION;  Surgeon: Mickel Baas, MD;  Location: [facility];  Service: Obstetrics;  Laterality: N/A;  . CHOLECYSTECTOMY    . DILATION AND CURETTAGE OF UTERUS  2007  . KNEE SURGERY    . OVARIAN CYST REMOVAL    . SHOULDER SURGERY       OB History    Gravida  2   Para  1   Term  0   Preterm  1   AB  1   Living  1     SAB  1   TAB  0   Ectopic  0   Multiple  0   Live Births  1            Home Medications    Prior to Admission medications   Medication Sig Start Date End Date Taking? Authorizing Provider    ALPRAZolam (XANAX) 0.25 MG tablet Take 0.25 mg by mouth 3 (three) times daily as needed for anxiety.    [provider]  amoxicillin-clavulanate (AUGMENTIN) 875-125 MG tablet Take 1 tablet by mouth 2 (two) times daily.    [provider]  cyclobenzaprine (FLEXERIL) 5 MG tablet Take 5 mg by mouth 3 (three) times daily as needed. For back pain.    [provider]  dicyclomine (BENTYL) 20 MG tablet Take 1 tablet (20 mg total) by mouth 2 (two) times daily. 10/24/16   Garlon Hatchet, PA-C  docusate sodium (COLACE) 100 MG capsule Take 1 capsule (100 mg total) by mouth 2 (two) times daily. 02/23/12   Waynard Reeds, MD  HYDROcodone-acetaminophen (NORCO/VICODIN) 5-325 MG tablet Take 1 tablet by mouth every 6 (six) hours as needed for moderate pain. 06/02/18   Usman Millett, Cristal Deer, PA-C  ibuprofen (ADVIL,MOTRIN) 600 MG tablet Take 1 tablet (600 mg total) by mouth every 6 (six) hours as needed for pain. 02/23/12   Waynard Reeds, MD  ibuprofen (ADVIL,MOTRIN) 800 MG tablet Take 1 tablet (800 mg total) by mouth every 8 (eight) hours as needed. 06/02/18  Khaya Theissen, Cristal Deer, PA-C  ondansetron (ZOFRAN ODT) 4 MG disintegrating tablet Take 1 tablet (4 mg total) by mouth every 8 (eight) hours as needed for nausea. 10/24/16   Garlon Hatchet, PA-C  oxyCODONE-acetaminophen (PERCOCET/ROXICET) 5-325 MG per tablet Take 1 tablet by mouth every 4 (four) hours as needed for pain. 02/28/12   Poe, Deirdre C, CNM  oxyCODONE-acetaminophen (ROXICET) 5-325 MG per tablet Take 2 tablets by mouth every 4 (four) hours as needed for pain. May take 1-2 tablets every 4-6 hours as needed for pain 02/28/12   Archie Patten, CNM  Prenatal Vit-Fe Fumarate-FA (PRENATAL MULTIVITAMIN) TABS Take 1 tablet by mouth daily.    [provider]  traMADol (ULTRAM) 50 MG tablet Take 1 tablet (50 mg total) by mouth every 6 (six) hours as needed for severe pain. 06/02/18   Charlestine Night, PA-C    Family History Family  History  Problem Relation Age of Onset  . Cancer Maternal Grandmother        Breast  . Stroke Maternal Grandfather   . Heart disease Paternal Grandmother   . Heart disease Paternal Grandfather     Social History Social History   Tobacco Use  . Smoking status: Current Every Day Smoker    Types: Cigarettes  . Smokeless tobacco: Never Used  Substance Use Topics  . Alcohol use: No  . Drug use: Not Currently     Allergies   Tramadol   Review of Systems Review of Systems All other systems negative except as documented in the HPI. All pertinent positives and negatives as reviewed in the HPI.  Physical Exam Updated Vital Signs BP (!) 107/50 (BP Location: Right Arm)   Pulse 76   Temp 97.7 F (36.5 C) (Oral)   Resp 16   Ht 5\' 5"  (1.651 m)   Wt 75.8 kg   LMP 12/11/2016   SpO2 99%   BMI 27.79 kg/m   Physical Exam Vitals signs and nursing note reviewed.  Constitutional:      General: She is not in acute distress.    Appearance: She is well-developed.  HENT:     Head: Normocephalic and atraumatic.  Eyes:     Pupils: Pupils are equal, round, and reactive to light.  Neck:     Musculoskeletal: Normal range of motion and neck supple.  Cardiovascular:     Rate and Rhythm: Normal rate and regular rhythm.     Heart sounds: Normal heart sounds. No murmur. No friction rub. No gallop.   Pulmonary:     Effort: Pulmonary effort is normal. No respiratory distress.     Breath sounds: Normal breath sounds. No wheezing.  Abdominal:     General: Bowel sounds are normal. There is no distension.     Palpations: Abdomen is soft.     Tenderness: There is no abdominal tenderness. There is no guarding or rebound.     Hernia: No hernia is present.  Skin:    General: Skin is warm and dry.     Capillary Refill: Capillary refill takes less than 2 seconds.     Findings: No erythema or rash.  Neurological:     Mental Status: She is alert and oriented to person, place, and time.      Motor: No abnormal muscle tone.     Coordination: Coordination normal.  Psychiatric:        Behavior: Behavior normal.      ED Treatments / Results  Labs (all labs ordered are listed, but  only abnormal results are displayed) Labs Reviewed  BASIC METABOLIC PANEL - Abnormal; Notable for the following components:      Result Value   Calcium 8.8 (*)    All other components within normal limits  URINALYSIS, ROUTINE W REFLEX MICROSCOPIC  CBC WITH DIFFERENTIAL/PLATELET    EKG None  Radiology No results found.  Procedures Procedures (including critical care time)  Medications Ordered in ED Medications  morphine 4 MG/ML injection 4 mg (4 mg Intravenous Given 06/02/18 2026)  ondansetron (ZOFRAN) injection 4 mg (4 mg Intravenous Given 06/02/18 2035)  ketorolac (TORADOL) 30 MG/ML injection 30 mg (30 mg Intravenous Given 06/02/18 2136)     Initial Impression / Assessment and Plan / ED Course  I have reviewed the triage vital signs and the nursing notes.  Pertinent labs & imaging results that were available during my care of the patient were reviewed by me and considered in my medical decision making (see chart for details).        Patient will be treated for her flank pain.  I did advise her that she could have passed a kidney stone this was causing her symptoms at this time.  I did advise that she could also have a lumbar strain as well.  The patient is advised to return here as needed told to increase her fluid intake and rest as much as possible. Final Clinical Impressions(s) / ED Diagnoses   Final diagnoses:  Acute left-sided low back pain, unspecified whether sciatica present  Flank pain    ED Discharge Orders         Ordered    ibuprofen (ADVIL,MOTRIN) 800 MG tablet  Every 8 hours PRN,   Status:  Discontinued     06/02/18 2228    traMADol (ULTRAM) 50 MG tablet  Every 6 hours PRN,   Status:  Discontinued     06/02/18 2228    ibuprofen (ADVIL,MOTRIN) 800 MG tablet  Every  8 hours PRN     06/02/18 2229    traMADol (ULTRAM) 50 MG tablet  Every 6 hours PRN,   Status:  Discontinued     06/02/18 2229    traMADol (ULTRAM) 50 MG tablet  Every 6 hours PRN,   Status:  Discontinued     06/02/18 2231    traMADol (ULTRAM) 50 MG tablet  Every 6 hours PRN     06/02/18 2232    HYDROcodone-acetaminophen (NORCO/VICODIN) 5-325 MG tablet  Every 6 hours PRN     06/02/18 2251           Charlestine Night, PA-C 06/05/18 0015    Vanetta Mulders, MD 06/05/18 [phone]

## 2018-08-11 ENCOUNTER — Inpatient Hospital Stay (HOSPITAL_BASED_OUTPATIENT_CLINIC_OR_DEPARTMENT_OTHER)
Admission: EM | Admit: 2018-08-11 | Discharge: 2018-08-11 | DRG: 917 | Discharge status: Against Medical Advice | Payer: 59 | Attending: Internal Medicine | Admitting: Internal Medicine

## 2018-08-11 ENCOUNTER — Other Ambulatory Visit: Payer: Self-pay

## 2018-08-11 ENCOUNTER — Emergency Department (HOSPITAL_BASED_OUTPATIENT_CLINIC_OR_DEPARTMENT_OTHER): Payer: 59

## 2018-08-11 ENCOUNTER — Encounter (HOSPITAL_BASED_OUTPATIENT_CLINIC_OR_DEPARTMENT_OTHER): Payer: Self-pay | Admitting: Emergency Medicine

## 2018-08-11 DIAGNOSIS — Z823 Family history of stroke: Secondary | ICD-10-CM

## 2018-08-11 DIAGNOSIS — F419 Anxiety disorder, unspecified: Secondary | ICD-10-CM | POA: Diagnosis present

## 2018-08-11 DIAGNOSIS — D72829 Elevated white blood cell count, unspecified: Secondary | ICD-10-CM | POA: Diagnosis present

## 2018-08-11 DIAGNOSIS — N179 Acute kidney failure, unspecified: Secondary | ICD-10-CM | POA: Diagnosis present

## 2018-08-11 DIAGNOSIS — E872 Acidosis: Secondary | ICD-10-CM | POA: Diagnosis present

## 2018-08-11 DIAGNOSIS — Z809 Family history of malignant neoplasm, unspecified: Secondary | ICD-10-CM

## 2018-08-11 DIAGNOSIS — F17210 Nicotine dependence, cigarettes, uncomplicated: Secondary | ICD-10-CM | POA: Diagnosis present

## 2018-08-11 DIAGNOSIS — R402312 Coma scale, best motor response, none, at arrival to emergency department: Secondary | ICD-10-CM | POA: Diagnosis present

## 2018-08-11 DIAGNOSIS — E876 Hypokalemia: Secondary | ICD-10-CM | POA: Diagnosis present

## 2018-08-11 DIAGNOSIS — Z885 Allergy status to narcotic agent status: Secondary | ICD-10-CM | POA: Diagnosis not present

## 2018-08-11 DIAGNOSIS — T50901A Poisoning by unspecified drugs, medicaments and biological substances, accidental (unintentional), initial encounter: Secondary | ICD-10-CM | POA: Diagnosis present

## 2018-08-11 DIAGNOSIS — I6783 Posterior reversible encephalopathy syndrome: Secondary | ICD-10-CM | POA: Diagnosis present

## 2018-08-11 DIAGNOSIS — R739 Hyperglycemia, unspecified: Secondary | ICD-10-CM

## 2018-08-11 DIAGNOSIS — Z8249 Family history of ischemic heart disease and other diseases of the circulatory system: Secondary | ICD-10-CM | POA: Diagnosis not present

## 2018-08-11 DIAGNOSIS — G92 Toxic encephalopathy: Secondary | ICD-10-CM | POA: Diagnosis present

## 2018-08-11 DIAGNOSIS — R402112 Coma scale, eyes open, never, at arrival to emergency department: Secondary | ICD-10-CM | POA: Diagnosis present

## 2018-08-11 DIAGNOSIS — T50904A Poisoning by unspecified drugs, medicaments and biological substances, undetermined, initial encounter: Secondary | ICD-10-CM

## 2018-08-11 DIAGNOSIS — R402212 Coma scale, best verbal response, none, at arrival to emergency department: Secondary | ICD-10-CM | POA: Diagnosis present

## 2018-08-11 DIAGNOSIS — I4891 Unspecified atrial fibrillation: Secondary | ICD-10-CM | POA: Diagnosis present

## 2018-08-11 DIAGNOSIS — Z1159 Encounter for screening for other viral diseases: Secondary | ICD-10-CM

## 2018-08-11 DIAGNOSIS — T424X1A Poisoning by benzodiazepines, accidental (unintentional), initial encounter: Secondary | ICD-10-CM | POA: Diagnosis not present

## 2018-08-11 LAB — URINALYSIS, ROUTINE W REFLEX MICROSCOPIC
Bilirubin Urine: NEGATIVE
Glucose, UA: >=500 mg/dL — AB
Hgb urine dipstick: NEGATIVE
Ketones, ur: NEGATIVE mg/dL
Leukocytes,Ua: NEGATIVE
Nitrite: NEGATIVE
Protein, ur: 30 mg/dL — AB
Specific Gravity, Urine: >1.03 — ABNORMAL HIGH (ref 1.005–1.030)
pH: 6 (ref 5.0–8.0)

## 2018-08-11 LAB — CBC WITH DIFFERENTIAL/PLATELET
Abs Immature Granulocytes: 0.12 10*3/uL — ABNORMAL HIGH (ref 0.00–0.07)
Basophils Absolute: 0.1 10*3/uL (ref 0.0–0.1)
Basophils Relative: 0 %
Eosinophils Absolute: 0 10*3/uL (ref 0.0–0.5)
Eosinophils Relative: 0 %
HCT: 41.2 % (ref 36.0–46.0)
Hemoglobin: 12.9 g/dL (ref 12.0–15.0)
Immature Granulocytes: 1 %
Lymphocytes Relative: 49 %
Lymphs Abs: 10.9 10*3/uL — ABNORMAL HIGH (ref 0.7–4.0)
MCH: 30.3 pg (ref 26.0–34.0)
MCHC: 31.3 g/dL (ref 30.0–36.0)
MCV: 96.7 fL (ref 80.0–100.0)
Monocytes Absolute: 1.5 10*3/uL — ABNORMAL HIGH (ref 0.1–1.0)
Monocytes Relative: 7 %
Neutro Abs: 9.4 10*3/uL — ABNORMAL HIGH (ref 1.7–7.7)
Neutrophils Relative %: 43 %
Platelets: 407 10*3/uL — ABNORMAL HIGH (ref 150–400)
RBC: 4.26 MIL/uL (ref 3.87–5.11)
RDW: 12.9 % (ref 11.5–15.5)
Smear Review: NORMAL
WBC: 22 10*3/uL — ABNORMAL HIGH (ref 4.0–10.5)
nRBC: 0 % (ref 0.0–0.2)

## 2018-08-11 LAB — COMPREHENSIVE METABOLIC PANEL
ALT: 30 U/L (ref 0–44)
AST: 32 U/L (ref 15–41)
Albumin: 4.1 g/dL (ref 3.5–5.0)
Alkaline Phosphatase: 71 U/L (ref 38–126)
Anion gap: 11 (ref 5–15)
BUN: 21 mg/dL — ABNORMAL HIGH (ref 6–20)
CO2: 21 mmol/L — ABNORMAL LOW (ref 22–32)
Calcium: 8.1 mg/dL — ABNORMAL LOW (ref 8.9–10.3)
Chloride: 101 mmol/L (ref 98–111)
Creatinine, Ser: 1.37 mg/dL — ABNORMAL HIGH (ref 0.44–1.00)
GFR calc Af Amer: 55 mL/min — ABNORMAL LOW (ref >60)
GFR calc non Af Amer: 47 mL/min — ABNORMAL LOW (ref >60)
Glucose, Bld: 513 mg/dL (ref 70–99)
Potassium: 3.1 mmol/L — ABNORMAL LOW (ref 3.5–5.1)
Sodium: 133 mmol/L — ABNORMAL LOW (ref 135–145)
Total Bilirubin: 0.5 mg/dL (ref 0.3–1.2)
Total Protein: 6.8 g/dL (ref 6.5–8.1)

## 2018-08-11 LAB — URINALYSIS, MICROSCOPIC (REFLEX)

## 2018-08-11 LAB — ETHANOL: Alcohol, Ethyl (B): <10 mg/dL (ref <10)

## 2018-08-11 LAB — CBG MONITORING, ED
Glucose-Capillary: 428 mg/dL — ABNORMAL HIGH (ref 70–99)
Glucose-Capillary: 93 mg/dL (ref 70–99)

## 2018-08-11 LAB — SALICYLATE LEVEL: Salicylate Lvl: <7 mg/dL (ref 2.8–30.0)

## 2018-08-11 LAB — RAPID URINE DRUG SCREEN, HOSP PERFORMED
Amphetamines: NOT DETECTED
Barbiturates: NOT DETECTED
Benzodiazepines: POSITIVE — AB
Cocaine: NOT DETECTED
Opiates: NOT DETECTED
Tetrahydrocannabinol: POSITIVE — AB

## 2018-08-11 LAB — LACTIC ACID, PLASMA: Lactic Acid, Venous: 4.6 mmol/L (ref 0.5–1.9)

## 2018-08-11 LAB — SARS CORONAVIRUS 2 AG (30 MIN TAT): SARS Coronavirus 2 Ag: NEGATIVE

## 2018-08-11 LAB — GLUCOSE, CAPILLARY: Glucose-Capillary: 87 mg/dL (ref 70–99)

## 2018-08-11 LAB — HCG, SERUM, QUALITATIVE: Preg, Serum: NEGATIVE

## 2018-08-11 LAB — ACETAMINOPHEN LEVEL: Acetaminophen (Tylenol), Serum: <10 ug/mL — ABNORMAL LOW (ref 10–30)

## 2018-08-11 MED ORDER — ACETAMINOPHEN 325 MG PO TABS: 650.0000 mg | ORAL_TABLET | Freq: Four times a day (QID) | ORAL | Status: DC | PRN

## 2018-08-11 MED ORDER — ACETAMINOPHEN 650 MG RE SUPP: 650.0000 mg | Freq: Four times a day (QID) | RECTAL | Status: DC | PRN

## 2018-08-11 MED ORDER — ENOXAPARIN SODIUM 40 MG/0.4ML ~~LOC~~ SOLN: 40.0000 mg | SUBCUTANEOUS | Status: DC

## 2018-08-11 MED ORDER — ONDANSETRON HCL 4 MG PO TABS: 4.0000 mg | ORAL_TABLET | Freq: Four times a day (QID) | ORAL | Status: DC | PRN

## 2018-08-11 MED ORDER — INSULIN ASPART 100 UNIT/ML ~~LOC~~ SOLN
6.0000 [IU] | Freq: Once | SUBCUTANEOUS | Status: AC
  Administered 2018-08-11: 6 [IU] via INTRAVENOUS
  Filled 2018-08-11: qty 1

## 2018-08-11 MED ORDER — SODIUM CHLORIDE 0.9 % IV BOLUS
1000.0000 mL | Freq: Once | INTRAVENOUS | Status: AC
  Administered 2018-08-11: 02:00:00 1000 mL via INTRAVENOUS

## 2018-08-11 MED ORDER — POTASSIUM CHLORIDE IN NACL 40-0.9 MEQ/L-% IV SOLN
Freq: Once | INTRAVENOUS | Status: DC
  Filled 2018-08-11: qty 1000

## 2018-08-11 MED ORDER — DILTIAZEM HCL 100 MG IV SOLR
5.0000 mg/h | INTRAVENOUS | Status: DC
  Administered 2018-08-11: 02:00:00 5 mg/h via INTRAVENOUS

## 2018-08-11 MED ORDER — ALBUTEROL SULFATE (2.5 MG/3ML) 0.083% IN NEBU: 2.5000 mg | INHALATION_SOLUTION | Freq: Four times a day (QID) | RESPIRATORY_TRACT | Status: DC | PRN

## 2018-08-11 MED ORDER — NALOXONE HCL 0.4 MG/ML IJ SOLN
0.4000 mg | Freq: Once | INTRAMUSCULAR | Status: AC
  Administered 2018-08-11: 01:00:00 0.4 mg via INTRAVENOUS

## 2018-08-11 MED ORDER — NALOXONE HCL 0.4 MG/ML IJ SOLN
INTRAMUSCULAR | Status: AC
  Filled 2018-08-11: qty 1

## 2018-08-11 MED ORDER — DILTIAZEM HCL-DEXTROSE 100-5 MG/100ML-% IV SOLN (PREMIX)
5.0000 mg/h | INTRAVENOUS | Status: DC
  Filled 2018-08-11: qty 100

## 2018-08-11 MED ORDER — DILTIAZEM HCL 100 MG IV SOLR
INTRAVENOUS | Status: AC
  Filled 2018-08-11: qty 100

## 2018-08-11 MED ORDER — ONDANSETRON HCL 4 MG/2ML IJ SOLN: 4.0000 mg | Freq: Four times a day (QID) | INTRAMUSCULAR | Status: DC | PRN

## 2018-08-11 NOTE — ED Triage Notes (Signed)
Private vehicle. Arrival. Pt unresponsive, agonal respirations. Pulse present.

## 2018-08-11 NOTE — Progress Notes (Signed)
Patient left AMA after provider Dr. Madelyn Flavors, MD spoke with patient about the risks and benefits of leaving.  Patient signed AMA paperwork and peripheral IV's removed.

## 2018-08-11 NOTE — ED Notes (Signed)
Patient assisted to bathroom; patient tolerated well.

## 2018-08-11 NOTE — ED Notes (Signed)
Patient presented with spouse; states found her unresponsive with agonal respirations; unsure of any known overdose or ingestion.

## 2018-08-11 NOTE — ED Notes (Signed)
Critical glucose received 513. Dr Judd Lien aware.

## 2018-08-11 NOTE — ED Provider Notes (Addendum)
[facility] EMERGENCY DEPARTMENT Provider Note   CSN: [id number] Arrival date & time: 08/11/18  0049    History   Chief Complaint Chief Complaint  Patient presents with  . Drug Overdose    HPI SANDE KEATS is a 43 y.o. female.     Patient is a 43 year old female with history of anxiety on chronic benzodiazepines, colitis, renal calculi, and reported opioid abuse in her prior medical records.  Patient was brought to the emergency department by private auto by her fianc for evaluation of altered level of consciousness.  From what I am told, he found her unresponsive and "would not wake up".  He then carried her to the car and drove her here.  Patient arrived here initially unresponsive, agonal, and offering no additional information.  The history is provided by the patient.    Past Medical History:  Diagnosis Date  . Anxiety   . Colitis   . Kidney stone   . Opioid abuse (HCC)    Per family    There are no active problems to display for this patient.   Past Surgical History:  Procedure Laterality Date  . ABDOMINAL HYSTERECTOMY    . CESAREAN SECTION  02/20/2012   Procedure: CESAREAN SECTION;  Surgeon: Mickel Baas, MD;  Location: [facility];  Service: Obstetrics;  Laterality: N/A;  . CHOLECYSTECTOMY    . DILATION AND CURETTAGE OF UTERUS  2007  . KNEE SURGERY    . OVARIAN CYST REMOVAL    . SHOULDER SURGERY       OB History    Gravida  2   Para  1   Term  0   Preterm  1   AB  1   Living  1     SAB  1   TAB  0   Ectopic  0   Multiple  0   Live Births  1            Home Medications    Prior to Admission medications   Medication Sig Start Date End Date Taking? Authorizing Provider  ALPRAZolam (XANAX) 0.25 MG tablet Take 0.25 mg by mouth 3 (three) times daily as needed for anxiety.    [provider]  amoxicillin-clavulanate (AUGMENTIN) 875-125 MG tablet Take 1 tablet by mouth 2 (two) times daily.    [provider]  cyclobenzaprine (FLEXERIL) 5 MG tablet Take 5 mg by mouth 3 (three) times daily as needed. For back pain.    [provider]  dicyclomine (BENTYL) 20 MG tablet Take 1 tablet (20 mg total) by mouth 2 (two) times daily. 10/24/16   Garlon Hatchet, PA-C  docusate sodium (COLACE) 100 MG capsule Take 1 capsule (100 mg total) by mouth 2 (two) times daily. 02/23/12   Waynard Reeds, MD  HYDROcodone-acetaminophen (NORCO/VICODIN) 5-325 MG tablet Take 1 tablet by mouth every 6 (six) hours as needed for moderate pain. 06/02/18   Lawyer, Cristal Deer, PA-C  ibuprofen (ADVIL,MOTRIN) 600 MG tablet Take 1 tablet (600 mg total) by mouth every 6 (six) hours as needed for pain. 02/23/12   Waynard Reeds, MD  ibuprofen (ADVIL,MOTRIN) 800 MG tablet Take 1 tablet (800 mg total) by mouth every 8 (eight) hours as needed. 06/02/18   Lawyer, Cristal Deer, PA-C  ondansetron (ZOFRAN ODT) 4 MG disintegrating tablet Take 1 tablet (4 mg total) by mouth every 8 (eight) hours as needed for nausea. 10/24/16   Garlon Hatchet, PA-C  oxyCODONE-acetaminophen (PERCOCET/ROXICET) 5-325 MG per  tablet Take 1 tablet by mouth every 4 (four) hours as needed for pain. 02/28/12   Poe, Deirdre C, CNM  oxyCODONE-acetaminophen (ROXICET) 5-325 MG per tablet Take 2 tablets by mouth every 4 (four) hours as needed for pain. May take 1-2 tablets every 4-6 hours as needed for pain 02/28/12   Archie Patten, CNM  Prenatal Vit-Fe Fumarate-FA (PRENATAL MULTIVITAMIN) TABS Take 1 tablet by mouth daily.    [provider]  traMADol (ULTRAM) 50 MG tablet Take 1 tablet (50 mg total) by mouth every 6 (six) hours as needed for severe pain. 06/02/18   Charlestine Night, PA-C    Family History Family History  Problem Relation Age of Onset  . Cancer Maternal Grandmother        Breast  . Stroke Maternal Grandfather   . Heart disease Paternal Grandmother   . Heart disease Paternal Grandfather     Social History Social History   Tobacco Use  .  Smoking status: Current Every Day Smoker    Types: Cigarettes  . Smokeless tobacco: Never Used  Substance Use Topics  . Alcohol use: No  . Drug use: Not Currently     Allergies   Tramadol   Review of Systems Review of Systems  Unable to perform ROS: Patient unresponsive     Physical Exam Updated Vital Signs LMP 12/11/2016   Physical Exam Vitals signs and nursing note reviewed.  Constitutional:      General: She is not in acute distress.    Appearance: She is well-developed. She is not diaphoretic.     Comments: Patient arrived here unresponsive.  GCS initially 3 with agonal respirations.  HENT:     Head: Normocephalic and atraumatic.  Neck:     Musculoskeletal: Normal range of motion and neck supple.  Cardiovascular:     Rate and Rhythm: Tachycardia present. Rhythm irregular.     Heart sounds: No murmur. No friction rub. No gallop.   Abdominal:     General: Bowel sounds are normal. There is no distension.     Palpations: Abdomen is soft.     Tenderness: There is no abdominal tenderness.  Musculoskeletal: Normal range of motion.  Skin:    General: Skin is warm and dry.  Neurological:     Mental Status: She is alert.     Comments: Patient arrived here agonal.  GCS was initially 3.      ED Treatments / Results  Labs (all labs ordered are listed, but only abnormal results are displayed) Labs Reviewed  COMPREHENSIVE METABOLIC PANEL  SALICYLATE LEVEL  ACETAMINOPHEN LEVEL  ETHANOL  RAPID URINE DRUG SCREEN, HOSP PERFORMED  CBC WITH DIFFERENTIAL/PLATELET  PREGNANCY, URINE  AMYLASE  URINALYSIS, ROUTINE W REFLEX MICROSCOPIC  HCG, SERUM, QUALITATIVE  LACTIC ACID, PLASMA  LACTIC ACID, PLASMA  CBG MONITORING, ED    EKG ED ECG REPORT   Date: 08/11/2018  Rate: 154  Rhythm: atrial fibrillation  QRS Axis: normal  Intervals: normal  ST/T Wave abnormalities: normal  Conduction Disutrbances:none  Narrative Interpretation:   Old EKG Reviewed: none available   I have personally reviewed the EKG tracing and agree with the computerized printout as noted.   Radiology No results found.  Procedures Procedures (including critical care time)  Medications Ordered in ED Medications  sodium chloride 0.9 % bolus 1,000 mL (has no administration in time range)     Initial Impression / Assessment and Plan / ED Course  I have reviewed the triage vital signs and the  nursing notes.  Pertinent labs & imaging results that were available during my care of the patient were reviewed by me and considered in my medical decision making (see chart for details).  Patient is a 43 year old female brought to the ER by her husband who found her unresponsive on the couch.  He tells me when he came home from work, she was slurring her words and acted as if she was under the influence.  He states they had an argument, then he went to bed.  He woke up several hours later and noticed she was not in bed and went to check on her.  She was found on the sofa unresponsive.  He picked her up off of the sofa and carried her to the car, then brought her here.  Patient arrived to the ER initially unresponsive with agonal respirations.  She was assisted from the car by the nursing staff, then immediately taken to room 14 for resuscitation.  Airway was managed with bag valve mask.  IV access was established and laboratory studies obtained.  She was then given 0.4 mg of Narcan.  Several minutes later she seemed to improve.  She began breathing spontaneously and moving her extremities.  She was then placed on a nonrebreather.  Patient is now awake and alert.  Work-up shows drug screen positive for benzodiazepines and marijuana, a glucose of 513, and EKG showing atrial fibrillation with rapid ventricular response.  She was given IV insulin, then started on a Cardizem drip.  Patient now awake and alert, but does become somnolent at times.  I have discussed the care with [facility] Th[facility]ning[facility]defeso [facility]  who agrees to accept the patient in transfer.  Her head CT shows findings suggestive of PRES, however this does not fit the clinical picture.  Patient may require further imaging with MRI at the discretion of the admitting physician.  CRITICAL CARE Performed by: Geoffery Lyonsouglas Lovelee Forner Total critical care time: 45 minutes Critical care time was exclusive of separately billable procedures and treating other patients. Critical care was necessary to treat or prevent imminent or life-threatening deterioration. Critical care was time spent personally by me on the following activities: development of treatment plan with patient and/or surrogate as well as nursing, discussions with consultants, evaluation of patient's response to treatment, examination of patient, obtaining history from patient or surrogate, ordering and performing treatments and interventions, ordering and review of laboratory studies, ordering and review of radiographic studies, pulse oximetry and re-evaluation of patient's condition.   Final Clinical Impressions(s) / ED Diagnoses   Final diagnoses:  None    ED Discharge Orders    None       Geoffery Lyonselo, Synethia Endicott, MD 08/11/18 [id number]    Geoffery Lyonselo, Camrin Gearheart, MD 08/11/18 [id number]
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
## 2018-08-11 NOTE — ED Notes (Signed)
Patient wakens and responsive to painful stimuli; placed on non-rebreather mask; tolerating well.

## 2018-08-11 NOTE — ED Notes (Signed)
Family at bedside. 

## 2018-08-11 NOTE — ED Notes (Signed)
[facility] notified (Tammy) - patient ready for transport 

## 2018-08-20 NOTE — Discharge Summary (Signed)
Refer to H&P as patient left AMA.

## 2019-12-27 DIAGNOSIS — W540XXA Bitten by dog, initial encounter: Secondary | ICD-10-CM

## 2019-12-27 HISTORY — DX: Bitten by dog, initial encounter: W54.0XXA

## 2019-12-29 ENCOUNTER — Emergency Department (HOSPITAL_BASED_OUTPATIENT_CLINIC_OR_DEPARTMENT_OTHER): Admission: EM | Admit: 2019-12-29 | Discharge: 2019-12-29 | Discharge status: Against Medical Advice | Payer: 59

## 2019-12-29 ENCOUNTER — Other Ambulatory Visit: Payer: Self-pay

## 2020-01-02 ENCOUNTER — Other Ambulatory Visit: Payer: Self-pay

## 2020-01-02 ENCOUNTER — Inpatient Hospital Stay: Admit: 2020-01-02 | Payer: 59 | Admitting: Orthopaedic Surgery

## 2020-01-02 ENCOUNTER — Encounter (HOSPITAL_BASED_OUTPATIENT_CLINIC_OR_DEPARTMENT_OTHER): Payer: Self-pay | Admitting: Emergency Medicine

## 2020-01-02 ENCOUNTER — Inpatient Hospital Stay (HOSPITAL_COMMUNITY): Payer: 59 | Admitting: Certified Registered"

## 2020-01-02 ENCOUNTER — Inpatient Hospital Stay (HOSPITAL_BASED_OUTPATIENT_CLINIC_OR_DEPARTMENT_OTHER)
Admission: EM | Admit: 2020-01-02 | Discharge: 2020-01-03 | DRG: 983 | Disposition: A | Discharge status: Home / Self Care | Payer: 59 | Attending: Internal Medicine | Admitting: Internal Medicine

## 2020-01-02 ENCOUNTER — Encounter (HOSPITAL_COMMUNITY)
Admission: EM | Disposition: A | Discharge status: Home / Self Care | Payer: Self-pay | Source: Home / Self Care | Attending: Internal Medicine

## 2020-01-02 ENCOUNTER — Emergency Department (HOSPITAL_BASED_OUTPATIENT_CLINIC_OR_DEPARTMENT_OTHER): Payer: 59

## 2020-01-02 DIAGNOSIS — Z8249 Family history of ischemic heart disease and other diseases of the circulatory system: Secondary | ICD-10-CM | POA: Diagnosis not present

## 2020-01-02 DIAGNOSIS — Z803 Family history of malignant neoplasm of breast: Secondary | ICD-10-CM

## 2020-01-02 DIAGNOSIS — Z20822 Contact with and (suspected) exposure to covid-19: Secondary | ICD-10-CM | POA: Diagnosis present

## 2020-01-02 DIAGNOSIS — L03114 Cellulitis of left upper limb: Secondary | ICD-10-CM | POA: Diagnosis present

## 2020-01-02 DIAGNOSIS — F17210 Nicotine dependence, cigarettes, uncomplicated: Secondary | ICD-10-CM | POA: Diagnosis present

## 2020-01-02 DIAGNOSIS — Z6829 Body mass index (BMI) 29.0-29.9, adult: Secondary | ICD-10-CM | POA: Diagnosis not present

## 2020-01-02 DIAGNOSIS — Z823 Family history of stroke: Secondary | ICD-10-CM | POA: Diagnosis not present

## 2020-01-02 DIAGNOSIS — S61452A Open bite of left hand, initial encounter: Secondary | ICD-10-CM

## 2020-01-02 DIAGNOSIS — S61439A Puncture wound without foreign body of unspecified hand, initial encounter: Secondary | ICD-10-CM | POA: Diagnosis present

## 2020-01-02 DIAGNOSIS — E663 Overweight: Secondary | ICD-10-CM | POA: Diagnosis present

## 2020-01-02 DIAGNOSIS — W540XXA Bitten by dog, initial encounter: Secondary | ICD-10-CM

## 2020-01-02 DIAGNOSIS — Z885 Allergy status to narcotic agent status: Secondary | ICD-10-CM

## 2020-01-02 DIAGNOSIS — F419 Anxiety disorder, unspecified: Secondary | ICD-10-CM | POA: Diagnosis present

## 2020-01-02 DIAGNOSIS — L089 Local infection of the skin and subcutaneous tissue, unspecified: Secondary | ICD-10-CM

## 2020-01-02 DIAGNOSIS — A419 Sepsis, unspecified organism: Secondary | ICD-10-CM | POA: Insufficient documentation

## 2020-01-02 HISTORY — PX: I & D EXTREMITY: SHX5045

## 2020-01-02 HISTORY — DX: Personal history of urinary calculi: Z87.442

## 2020-01-02 LAB — CBC WITH DIFFERENTIAL/PLATELET
Abs Immature Granulocytes: 0.06 10*3/uL (ref 0.00–0.07)
Basophils Absolute: 0 10*3/uL (ref 0.0–0.1)
Basophils Relative: 0 %
Eosinophils Absolute: 0 10*3/uL (ref 0.0–0.5)
Eosinophils Relative: 0 %
HCT: 42.2 % (ref 36.0–46.0)
Hemoglobin: 14.3 g/dL (ref 12.0–15.0)
Immature Granulocytes: 1 %
Lymphocytes Relative: 15 %
Lymphs Abs: 2 10*3/uL (ref 0.7–4.0)
MCH: 30.8 pg (ref 26.0–34.0)
MCHC: 33.9 g/dL (ref 30.0–36.0)
MCV: 90.8 fL (ref 80.0–100.0)
Monocytes Absolute: 0.7 10*3/uL (ref 0.1–1.0)
Monocytes Relative: 5 %
Neutro Abs: 10.2 10*3/uL — ABNORMAL HIGH (ref 1.7–7.7)
Neutrophils Relative %: 79 %
Platelets: 311 10*3/uL (ref 150–400)
RBC: 4.65 MIL/uL (ref 3.87–5.11)
RDW: 12.9 % (ref 11.5–15.5)
WBC: 13 10*3/uL — ABNORMAL HIGH (ref 4.0–10.5)
nRBC: 0 % (ref 0.0–0.2)

## 2020-01-02 LAB — SURGICAL PCR SCREEN
MRSA, PCR: NEGATIVE
Staphylococcus aureus: NEGATIVE

## 2020-01-02 LAB — BASIC METABOLIC PANEL
Anion gap: 6 (ref 5–15)
BUN: 14 mg/dL (ref 6–20)
CO2: 24 mmol/L (ref 22–32)
Calcium: 9.7 mg/dL (ref 8.9–10.3)
Chloride: 108 mmol/L (ref 98–111)
Creatinine, Ser: 0.75 mg/dL (ref 0.44–1.00)
GFR calc non Af Amer: >60 mL/min (ref >60)
Glucose, Bld: 95 mg/dL (ref 70–99)
Potassium: 4.2 mmol/L (ref 3.5–5.1)
Sodium: 138 mmol/L (ref 135–145)

## 2020-01-02 LAB — RESPIRATORY PANEL BY RT PCR (FLU A&B, COVID)
Influenza A by PCR: NEGATIVE
Influenza B by PCR: NEGATIVE
SARS Coronavirus 2 by RT PCR: NEGATIVE

## 2020-01-02 LAB — HIV ANTIBODY (ROUTINE TESTING W REFLEX): HIV Screen 4th Generation wRfx: NONREACTIVE

## 2020-01-02 LAB — LACTIC ACID, PLASMA: Lactic Acid, Venous: 0.7 mmol/L (ref 0.5–1.9)

## 2020-01-02 SURGERY — IRRIGATION AND DEBRIDEMENT EXTREMITY
Anesthesia: Monitor Anesthesia Care | Laterality: Left

## 2020-01-02 MED ORDER — CALCIUM CARBONATE ANTACID 500 MG PO CHEW
1.0000 | CHEWABLE_TABLET | ORAL | Status: DC | PRN
  Administered 2020-01-02 – 2020-01-03 (×2): 200 mg via ORAL
  Filled 2020-01-02 (×2): qty 1

## 2020-01-02 MED ORDER — DEXAMETHASONE SODIUM PHOSPHATE 10 MG/ML IJ SOLN
INTRAMUSCULAR | Status: DC | PRN
  Administered 2020-01-02: 10 mg

## 2020-01-02 MED ORDER — PROPOFOL 10 MG/ML IV BOLUS
INTRAVENOUS | Status: DC | PRN
  Administered 2020-01-02 (×2): 20 mg via INTRAVENOUS
  Administered 2020-01-02: 10 mg via INTRAVENOUS

## 2020-01-02 MED ORDER — ONDANSETRON HCL 4 MG/2ML IJ SOLN
INTRAMUSCULAR | Status: AC
  Filled 2020-01-02: qty 2

## 2020-01-02 MED ORDER — DEXAMETHASONE SODIUM PHOSPHATE 10 MG/ML IJ SOLN
INTRAMUSCULAR | Status: AC
  Filled 2020-01-02: qty 1

## 2020-01-02 MED ORDER — PROPOFOL 10 MG/ML IV BOLUS
INTRAVENOUS | Status: AC
  Filled 2020-01-02: qty 20

## 2020-01-02 MED ORDER — IOHEXOL 300 MG/ML  SOLN
100.0000 mL | Freq: Once | INTRAMUSCULAR | Status: AC | PRN
  Administered 2020-01-02: 100 mL via INTRAVENOUS

## 2020-01-02 MED ORDER — PHENYLEPHRINE 40 MCG/ML (10ML) SYRINGE FOR IV PUSH (FOR BLOOD PRESSURE SUPPORT)
PREFILLED_SYRINGE | INTRAVENOUS | Status: DC | PRN
  Administered 2020-01-02: 80 ug via INTRAVENOUS

## 2020-01-02 MED ORDER — HYDROMORPHONE HCL 1 MG/ML IJ SOLN
1.0000 mg | Freq: Once | INTRAMUSCULAR | Status: AC
  Administered 2020-01-02: 1 mg via INTRAVENOUS
  Filled 2020-01-02: qty 1

## 2020-01-02 MED ORDER — ACETAMINOPHEN 650 MG RE SUPP: 650.0000 mg | Freq: Four times a day (QID) | RECTAL | Status: DC | PRN

## 2020-01-02 MED ORDER — ONDANSETRON HCL 4 MG/2ML IJ SOLN
INTRAMUSCULAR | Status: DC | PRN
  Administered 2020-01-02: 4 mg via INTRAVENOUS

## 2020-01-02 MED ORDER — ONDANSETRON HCL 4 MG/2ML IJ SOLN
4.0000 mg | Freq: Once | INTRAMUSCULAR | Status: AC
  Administered 2020-01-02: 4 mg via INTRAVENOUS
  Filled 2020-01-02: qty 2

## 2020-01-02 MED ORDER — PROPOFOL 500 MG/50ML IV EMUL
INTRAVENOUS | Status: DC | PRN
  Administered 2020-01-02: 100 ug/kg/min via INTRAVENOUS

## 2020-01-02 MED ORDER — LIDOCAINE 2% (20 MG/ML) 5 ML SYRINGE
INTRAMUSCULAR | Status: AC
  Filled 2020-01-02: qty 5

## 2020-01-02 MED ORDER — PIPERACILLIN-TAZOBACTAM 3.375 G IVPB 30 MIN
3.3750 g | Freq: Once | INTRAVENOUS | Status: AC
  Administered 2020-01-02: 3.375 g via INTRAVENOUS
  Filled 2020-01-02: qty 50

## 2020-01-02 MED ORDER — HYDROMORPHONE HCL 1 MG/ML IJ SOLN: 0.2500 mg | INTRAMUSCULAR | Status: DC | PRN

## 2020-01-02 MED ORDER — OXYCODONE HCL 5 MG PO TABS
5.0000 mg | ORAL_TABLET | ORAL | Status: DC | PRN
  Administered 2020-01-02 – 2020-01-03 (×3): 5 mg via ORAL
  Filled 2020-01-02 (×3): qty 1

## 2020-01-02 MED ORDER — OXYCODONE HCL 5 MG/5ML PO SOLN: 5.0000 mg | Freq: Once | ORAL | Status: DC | PRN

## 2020-01-02 MED ORDER — OXYCODONE HCL 5 MG PO TABS: 5.0000 mg | ORAL_TABLET | Freq: Once | ORAL | Status: DC | PRN

## 2020-01-02 MED ORDER — ONDANSETRON HCL 4 MG PO TABS: 4.0000 mg | ORAL_TABLET | Freq: Four times a day (QID) | ORAL | Status: DC | PRN

## 2020-01-02 MED ORDER — ACETAMINOPHEN 325 MG PO TABS: 650.0000 mg | ORAL_TABLET | Freq: Four times a day (QID) | ORAL | Status: DC | PRN

## 2020-01-02 MED ORDER — PIPERACILLIN-TAZOBACTAM 3.375 G IVPB
3.3750 g | Freq: Three times a day (TID) | INTRAVENOUS | Status: DC
  Administered 2020-01-02 – 2020-01-03 (×3): 3.375 g via INTRAVENOUS
  Filled 2020-01-02 (×4): qty 50

## 2020-01-02 MED ORDER — FENTANYL CITRATE (PF) 250 MCG/5ML IJ SOLN
INTRAMUSCULAR | Status: DC | PRN
  Administered 2020-01-02 (×2): 50 ug via INTRAVENOUS

## 2020-01-02 MED ORDER — FENTANYL CITRATE (PF) 250 MCG/5ML IJ SOLN
INTRAMUSCULAR | Status: AC
  Filled 2020-01-02: qty 5

## 2020-01-02 MED ORDER — PROMETHAZINE HCL 25 MG/ML IJ SOLN: 6.2500 mg | INTRAMUSCULAR | Status: DC | PRN

## 2020-01-02 MED ORDER — LIDOCAINE-EPINEPHRINE (PF) 1.5 %-1:200000 IJ SOLN
INTRAMUSCULAR | Status: DC | PRN
  Administered 2020-01-02: 10 mL via PERINEURAL

## 2020-01-02 MED ORDER — DEXMEDETOMIDINE HCL 200 MCG/2ML IV SOLN
INTRAVENOUS | Status: DC | PRN
  Administered 2020-01-02: 8 ug via INTRAVENOUS
  Administered 2020-01-02: 4 ug via INTRAVENOUS
  Administered 2020-01-02: 8 ug via INTRAVENOUS

## 2020-01-02 MED ORDER — SODIUM CHLORIDE 0.9 % IR SOLN
Status: DC | PRN
  Administered 2020-01-02: 3000 mL

## 2020-01-02 MED ORDER — SODIUM CHLORIDE 0.9 % IV SOLN
INTRAVENOUS | Status: DC | PRN
  Administered 2020-01-02 (×2): 250 mL via INTRAVENOUS

## 2020-01-02 MED ORDER — HYDROMORPHONE HCL 1 MG/ML IJ SOLN
0.5000 mg | INTRAMUSCULAR | Status: DC | PRN
  Administered 2020-01-02: 0.5 mg via INTRAVENOUS
  Filled 2020-01-02: qty 1

## 2020-01-02 MED ORDER — INFLUENZA VAC SPLIT QUAD 0.5 ML IM SUSY
0.5000 mL | PREFILLED_SYRINGE | INTRAMUSCULAR | Status: DC
  Filled 2020-01-02: qty 0.5

## 2020-01-02 MED ORDER — MIDAZOLAM HCL 5 MG/5ML IJ SOLN
INTRAMUSCULAR | Status: DC | PRN
  Administered 2020-01-02 (×2): 1 mg via INTRAVENOUS

## 2020-01-02 MED ORDER — SODIUM CHLORIDE 0.9 % IV SOLN: INTRAVENOUS | Status: DC

## 2020-01-02 MED ORDER — ONDANSETRON HCL 4 MG/2ML IJ SOLN: 4.0000 mg | Freq: Four times a day (QID) | INTRAMUSCULAR | Status: DC | PRN

## 2020-01-02 MED ORDER — SODIUM CHLORIDE 0.9 % IV SOLN: INTRAVENOUS | Status: DC | PRN

## 2020-01-02 MED ORDER — MIDAZOLAM HCL 2 MG/2ML IJ SOLN
INTRAMUSCULAR | Status: AC
  Filled 2020-01-02: qty 2

## 2020-01-02 MED ORDER — VANCOMYCIN HCL IN DEXTROSE 1-5 GM/200ML-% IV SOLN
1000.0000 mg | Freq: Once | INTRAVENOUS | Status: AC
  Administered 2020-01-02: 1000 mg via INTRAVENOUS
  Filled 2020-01-02: qty 200

## 2020-01-02 MED ORDER — ROPIVACAINE HCL 5 MG/ML IJ SOLN
INTRAMUSCULAR | Status: DC | PRN
  Administered 2020-01-02: 15 mL via PERINEURAL

## 2020-01-02 SURGICAL SUPPLY — 42 items
BNDG ELASTIC 4X5.8 VLCR STR LF (GAUZE/BANDAGES/DRESSINGS) ×3 IMPLANT
BNDG ESMARK 4X9 LF (GAUZE/BANDAGES/DRESSINGS) IMPLANT
BNDG GAUZE ELAST 4 BULKY (GAUZE/BANDAGES/DRESSINGS) ×3 IMPLANT
CHLORAPREP W/TINT 26 (MISCELLANEOUS) ×3 IMPLANT
CORD BIPOLAR FORCEPS 12FT (ELECTRODE) ×3 IMPLANT
COVER SURGICAL LIGHT HANDLE (MISCELLANEOUS) ×3 IMPLANT
COVER WAND RF STERILE (DRAPES) ×3 IMPLANT
CUFF TOURN SGL QUICK 18X4 (TOURNIQUET CUFF) ×3 IMPLANT
CUFF TOURN SGL QUICK 24 (TOURNIQUET CUFF)
CUFF TRNQT CYL 24X4X16.5-23 (TOURNIQUET CUFF) IMPLANT
DRAPE U-SHAPE 47X51 STRL (DRAPES) ×3 IMPLANT
DRSG XEROFORM 1X8 (GAUZE/BANDAGES/DRESSINGS) ×3 IMPLANT
GAUZE PACKING IODOFORM 1/4X15 (PACKING) ×3 IMPLANT
GAUZE SPONGE 4X4 12PLY STRL (GAUZE/BANDAGES/DRESSINGS) ×3 IMPLANT
GAUZE XEROFORM 5X9 LF (GAUZE/BANDAGES/DRESSINGS) ×3 IMPLANT
GLOVE BIOGEL PI IND STRL 8 (GLOVE) ×1 IMPLANT
GLOVE BIOGEL PI INDICATOR 8 (GLOVE) ×2
GLOVE SURG SYN 7.5  E (GLOVE) ×3
GLOVE SURG SYN 7.5 E (GLOVE) ×1 IMPLANT
GOWN STRL REUS W/ TWL LRG LVL3 (GOWN DISPOSABLE) ×1 IMPLANT
GOWN STRL REUS W/TWL LRG LVL3 (GOWN DISPOSABLE) ×3
KIT BASIN OR (CUSTOM PROCEDURE TRAY) ×3 IMPLANT
KIT TURNOVER KIT B (KITS) ×3 IMPLANT
MANIFOLD NEPTUNE II (INSTRUMENTS) ×3 IMPLANT
NEEDLE HYPO 25GX1X1/2 BEV (NEEDLE) IMPLANT
NS IRRIG 1000ML POUR BTL (IV SOLUTION) ×3 IMPLANT
PACK ORTHO EXTREMITY (CUSTOM PROCEDURE TRAY) ×3 IMPLANT
PAD ARMBOARD 7.5X6 YLW CONV (MISCELLANEOUS) ×3 IMPLANT
PAD CAST 4YDX4 CTTN HI CHSV (CAST SUPPLIES) ×1 IMPLANT
PADDING CAST COTTON 4X4 STRL (CAST SUPPLIES) ×3
SET CYSTO W/LG BORE CLAMP LF (SET/KITS/TRAYS/PACK) ×3 IMPLANT
SPONGE LAP 4X18 RFD (DISPOSABLE) IMPLANT
SWAB CULTURE ESWAB REG 1ML (MISCELLANEOUS) ×3 IMPLANT
SYR CONTROL 10ML LL (SYRINGE) IMPLANT
TOWEL GREEN STERILE (TOWEL DISPOSABLE) ×3 IMPLANT
TOWEL GREEN STERILE FF (TOWEL DISPOSABLE) ×3 IMPLANT
TUBE CONNECTING 12'X1/4 (SUCTIONS) ×1
TUBE CONNECTING 12X1/4 (SUCTIONS) ×2 IMPLANT
TUBING TUR DISP (UROLOGICAL SUPPLIES) IMPLANT
UNDERPAD 30X36 HEAVY ABSORB (UNDERPADS AND DIAPERS) ×6 IMPLANT
WATER STERILE IRR 1000ML POUR (IV SOLUTION) ×3 IMPLANT
YANKAUER SUCT BULB TIP NO VENT (SUCTIONS) ×3 IMPLANT

## 2020-01-02 NOTE — Op Note (Signed)
PREOPERATIVE DIAGNOSIS: Left hand infection after dog bite  POSTOPERATIVE DIAGNOSIS: Same  ATTENDING PHYSICIAN: Maudry Mayhew. Jeannie Fend, III, MD who was present and scrubbed for the entire case   ASSISTANT SURGEON: None.   ANESTHESIA: Regional with MAC  SURGICAL PROCEDURES:  1.  Irrigation and debridement of left infected thenar puncture wound 2.  Irrigation and excisional debridement of left dorsal hand puncture wound 3.  Extensor tenosynovectomy to the left EDC, EDQ P and ECU tendons on the dorsum of the hand  SURGICAL INDICATIONS: Patient is a 44 year old female who over the weekend was breaking up an altercation with her 2 dogs.  She sustained a accidental bite to her hand.  She was seen at outside urgent care where she was placed on Augmentin.  Over the past several days she noticed increasing pain, swelling and redness throughout the left hand and she presented to [place].  At that time there was concern for a more significant infection to her left hand which was unresponsive to her oral antibiotics.  A subsequent CT scan with contrast of the hand was obtained.  This showed some dorsal cellulitis as well as small fluid collection around the dorsal puncture wound.  We discussed treatment options and I did recommend proceeding forward with irrigation debridement of her hand as well as admission to the medicine service.  FINDING: Thenar puncture wound was excisionally debrided with some superficial purulence within the soft tissues.  The dorsal hand puncture wound was also debrided.  There was infectious, purulent fluid within the soft tissues surrounding the tendons of the EDC, EDQ P and ECU on the dorsum of the hand.  Successful debridement as well as irrigation was performed.  DESCRIPTION OF PROCEDURE: Patient was identified in the preoperative holding area where the risk benefits and alternatives of the procedure were discussed with the patient.  These risks include but are not  limited to infection, bleeding, damage to surrounding structures including blood vessels and nerves, pain, stiffness and need for additional procedures.  Informed consent was obtained that time the patient's left arm was marked with a surgical marking pen.  She then underwent a left upper extremity plexus block by anesthesia.  She was brought to the operative suite where timeout was performed identifying the correct patient operative site.  She was positioned supine on the operative table with the hand outstretched on a hand table.  A tourniquet was placed on the upper arm and the patient was induced under MAC sedation.  The left upper extremity was then prepped and draped in usual sterile fashion.  The the limb was exsanguinated by gravity and the tourniquet was inflated.  We began with and debridement of the thenar puncture wound. There was a 1 cm transverse laceration over the thenar area. A curette was introduced into the wound to debride the area. There was a small amount of superficial purulent material which was removed.  It did not extend deep into the thenar musculature.  Attention was then turned to the dorsal wound.  There is also a 1 cm transverse laceration over the dorsal and ulnar hand.  This was extended distally into the hand.  Blunt dissection was carried down through the subcutaneous tissues.  There is purulence throughout the dorsum of the hand with a significant amount deep around the ECU and EDQ P tendons in the hand.  This area was cultured for both aerobic and anaerobic bacteria.  Thorough debridement of the area was then performed excisionally with scissors  as well as rondure.  Continued debridement was also performed removing tenosynovium to the ED C tendons to the small and ring finger, EDQ P tendon as well as the ECU tendon.  Once thorough debridement and all infectious material had been removed, 3 L of normal saline via cystoscopy tubing were used to irrigate both wounds  copiously.  The dorsal hand incision was closed with interrupted 4-0 Prolene sutures.  Iodoform packing was then packed into both the dorsal hand wound as well as the thenar wound.  Xeroform, 4 x 4's and a well-padded soft dressing were then applied.  The tourniquet was released and the patient had return of brisk capillary refill to all of her digits.  She tolerated the procedure well and there are no complications.  ESTIMATED BLOOD LOSS: 10 mL  TOURNIQUET TIME: 15 minutes  SPECIMENS: Aerobic and anaerobic cultures x1  POSTOPERATIVE PLAN: Patient will be admitted back to the hospital service.  We will follow-up her cultures.  Once antibiotic plan has been determined she can be discharged home from hand surgery standpoint.  She can follow-up with me next week for close follow-up.  IMPLANTS: None

## 2020-01-02 NOTE — Transfer of Care (Signed)
Immediate Anesthesia Transfer of Care Note  Patient: Anna Huang  Procedure(s) Performed: IRRIGATION AND DEBRIDEMENT LEFT HAND (Left )  Patient Location: PACU  Anesthesia Type:MAC and General  Level of Consciousness: awake and alert   Airway & Oxygen Therapy: Patient Spontanous Breathing and Patient connected to face mask oxygen  Post-op Assessment: Report given to RN and Post -op Vital signs reviewed and stable  Post vital signs: Reviewed  Last Vitals:  Vitals Value Taken Time  BP    Temp    Pulse 92 01/02/20 1701  Resp 13 01/02/20 1701  SpO2 100 % 01/02/20 1701  Vitals shown include unvalidated device data.  Last Pain:  Vitals:   01/02/20 1247  TempSrc: Oral  PainSc: 6       Patients Stated Pain Goal: 2 (82/06/01 5615)  Complications: No complications documented.

## 2020-01-02 NOTE — H&P (Signed)
History and Physical    Anna Huang MWU:[id number] DOB: 05/29/1975 DOA: 01/02/2020  I have briefly reviewed the patient's prior medical records in [facility]  PCP: [place], [place], [place]  Patient coming from: home  Chief Complaint: left hand swelling  HPI: Anna Huang is a 44 y.o. female without significant past medical history who comes to the hospital with complaints of left hand swelling after a dog bite.  Patient was playing with her dog about 4 days ago and suffered a bite to the left hand.  The day afterwards she noticed swelling, pain, and went to urgent care and she was prescribed Augmentin.  She has been taking Augmentin for the past 3 days however she has noticed a lack of improvement, the swelling may be getting worse and now has difficulties moving her wrist as well as moving her fingers.  She denies any systemic symptoms, denies any fever, denies any chills.  No nausea or vomiting.  Otherwise she is her normal state of health, no chest pain, no shortness of breath, no lightheadedness or dizziness.  Dog has been vaccinated, she is up-to-date with her tetanus.  ED Course: In the ED she is afebrile 98.7, normotensive and satting well on room air, blood work is unremarkable except for WBC of 13, Covid is negative.  Lactic acid is 0.7.  CT of the hand showed cellulitis without any fluid collection or soft tissue gas, no tenosynovitis and no acute osseous abnormalities.  Hand surgery was consulted, patient was admitted to the hospital.  Review of Systems: All systems reviewed, and apart from HPI, all negative  Past Medical History:  Diagnosis Date  . Anxiety   . Colitis   . Kidney stone   . Opioid abuse [facility])    Per family    Past Surgical History:  Procedure Laterality Date  . ABDOMINAL HYSTERECTOMY    . CESAREAN SECTION  02/20/2012   Procedure: CESAREAN SECTION;  Surgeon: Mickel Baas, MD;  Location: [facility];  Service: Obstetrics;  Laterality: N/A;  .  CHOLECYSTECTOMY    . DILATION AND CURETTAGE OF UTERUS  2007  . KNEE SURGERY    . OVARIAN CYST REMOVAL    . SHOULDER SURGERY       reports that she has been smoking cigarettes. She has never used smokeless tobacco. She reports current drug use. Drug: Marijuana. She reports that she does not drink alcohol.  Allergies  Allergen Reactions  . Tramadol Nausea Only    Family History  Problem Relation Age of Onset  . Cancer Maternal Grandmother        Breast  . Stroke Maternal Grandfather   . Heart disease Paternal Grandmother   . Heart disease Paternal Grandfather     Prior to Admission medications   Medication Sig Start Date End Date Taking? Authorizing Provider  ALPRAZolam (XANAX) 0.25 MG tablet Take 0.25 mg by mouth 3 (three) times daily as needed for anxiety.    [provider]  amoxicillin-clavulanate (AUGMENTIN) 875-125 MG tablet Take 1 tablet by mouth 2 (two) times daily.    [provider]  cyclobenzaprine (FLEXERIL) 5 MG tablet Take 5 mg by mouth 3 (three) times daily as needed. For back pain.    [provider]  dicyclomine (BENTYL) 20 MG tablet Take 1 tablet (20 mg total) by mouth 2 (two) times daily. 10/24/16   Garlon Hatchet, PA-C  docusate sodium (COLACE) 100 MG capsule Take 1 capsule (100 mg total) by  mouth 2 (two) times daily. 02/23/12   Waynard Reeds, MD  HYDROcodone-acetaminophen (NORCO/VICODIN) 5-325 MG tablet Take 1 tablet by mouth every 6 (six) hours as needed for moderate pain. 06/02/18   Lawyer, Cristal Deer, PA-C  ibuprofen (ADVIL,MOTRIN) 600 MG tablet Take 1 tablet (600 mg total) by mouth every 6 (six) hours as needed for pain. 02/23/12   Waynard Reeds, MD  ibuprofen (ADVIL,MOTRIN) 800 MG tablet Take 1 tablet (800 mg total) by mouth every 8 (eight) hours as needed. 06/02/18   Lawyer, Cristal Deer, PA-C  ondansetron (ZOFRAN ODT) 4 MG disintegrating tablet Take 1 tablet (4 mg total) by mouth every 8 (eight) hours as needed for nausea. 10/24/16    Garlon Hatchet, PA-C  oxyCODONE-acetaminophen (PERCOCET/ROXICET) 5-325 MG per tablet Take 1 tablet by mouth every 4 (four) hours as needed for pain. 02/28/12   Poe, Deirdre C, CNM  oxyCODONE-acetaminophen (ROXICET) 5-325 MG per tablet Take 2 tablets by mouth every 4 (four) hours as needed for pain. May take 1-2 tablets every 4-6 hours as needed for pain 02/28/12   Archie Patten, CNM  Prenatal Vit-Fe Fumarate-FA (PRENATAL MULTIVITAMIN) TABS Take 1 tablet by mouth daily.    [provider]  traMADol (ULTRAM) 50 MG tablet Take 1 tablet (50 mg total) by mouth every 6 (six) hours as needed for severe pain. 06/02/18   Charlestine Night, PA-C    Physical Exam: Vitals:   01/02/20 0749 01/02/20 0949 01/02/20 1115 01/02/20 1247  BP:  102/69 107/68 110/76  Pulse:  82 72   Resp:  16 12 14   Temp:    98.5 F (36.9 C)  TempSrc:    Oral  SpO2: 100% 99% 100% 100%  Weight:    81.5 kg  Height:    5\' 5"  (1.651 m)    Constitutional: NAD, calm, comfortable Eyes: PERRL, lids and conjunctivae normal ENMT: Mucous membranes are moist. Posterior pharynx clear of any exudate or lesions.Normal dentition.  Neck: normal, supple Respiratory: clear to auscultation bilaterally, no wheezing, no crackles. Normal respiratory effort. No accessory muscle use.  Cardiovascular: Regular rate and rhythm, no murmurs / rubs / gallops. No extremity edema. Abdomen: no tenderness, no masses palpated. Bowel sounds positive.  Musculoskeletal: no clubbing / cyanosis. Normal muscle tone.  Skin: Left hand swelling, puncture site present, see picture in the ED note Neurologic: CN 2-12 grossly intact. Strength 5/5 in all 4.   Labs on Admission: I have personally reviewed following labs and imaging studies  CBC: Recent Labs  Lab 01/02/20 0758  WBC 13.0*  NEUTROABS 10.2*  HGB 14.3  HCT 42.2  MCV 90.8  PLT 311   Basic Metabolic Panel: Recent Labs  Lab 01/02/20 0758  NA 138  K 4.2  CL 108  CO2 24  GLUCOSE 95    BUN 14  CREATININE 0.75  CALCIUM 9.7   Liver Function Tests: No results for input(s): AST, ALT, ALKPHOS, BILITOT, PROT, ALBUMIN in the last 168 hours. Coagulation Profile: No results for input(s): INR, PROTIME in the last 168 hours. BNP (last 3 results) No results for input(s): PROBNP in the last 8760 hours. CBG: No results for input(s): GLUCAP in the last 168 hours. Thyroid Function Tests: No results for input(s): TSH, T4TOTAL, FREET4, T3FREE, THYROIDAB in the last 72 hours. Urine analysis:    Component Value Date/Time   COLORURINE YELLOW 08/11/2018 0059   APPEARANCEUR CLEAR 08/11/2018 0059   LABSPEC >1.030 (H) 08/11/2018 0059   PHURINE 6.0 08/11/2018 0059   GLUCOSEU >=500 (  A) 08/11/2018 0059   HGBUR NEGATIVE 08/11/2018 0059   BILIRUBINUR NEGATIVE 08/11/2018 0059   KETONESUR NEGATIVE 08/11/2018 0059   PROTEINUR 30 (A) 08/11/2018 0059   UROBILINOGEN 0.2 02/13/2012 2140   NITRITE NEGATIVE 08/11/2018 0059   LEUKOCYTESUR NEGATIVE 08/11/2018 0059     Radiological Exams on Admission: CT HAND LEFT W CONTRAST  Result Date: 01/02/2020 CLINICAL DATA:  Dog bite to left hand 12/29/2019. Now with redness, swelling, and pain. Infection suspected. EXAM: CT OF THE UPPER LEFT EXTREMITY WITH CONTRAST TECHNIQUE: Multidetector CT imaging of the left hand was performed according to the standard protocol following intravenous contrast administration. CONTRAST:  OMNIPAQUE IOHEXOL 300 MG/ML  SOLN COMPARISON:  X-ray 12/30/2019, 12/18/2016 FINDINGS: Bones/Joint/Cartilage No acute fracture. No dislocation. 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 No bony erosion or periostitis. Joint spaces are maintained without significant arthropathy. No appreciable radiocarpal joint effusion. Ligaments Suboptimally assessed by CT. Muscles and Tendons Tendinous structures appear intact within the limitations of CT. No appreciable tenosynovial fluid collection.  Unremarkable muscle bulk. No intramuscular fluid collection. Soft tissues Soft tissue swelling with subcutaneous edema overlying the dorsum of the hand. No organized soft tissue fluid collection. No soft tissue gas. No radiopaque soft tissue foreign body. IMPRESSION: 1. Soft tissue swelling with subcutaneous edema overlying the dorsum of the hand, suggestive of cellulitis. No organized soft tissue fluid collection. No soft tissue gas. 2. No evidence of tenosynovitis. 3. No acute osseous abnormality. 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 Electronically Signed   By: Duanne Guess D.O.   On: 01/02/2020 09:07    Assessment/Plan  Principal Problem Left hand infection/cellulitis following dog bite-hand surgery consulted, it appears that she is scheduled to get a washout in the OR later this afternoon.  Patient was started on Zosyn, continue.  Pain control with Tylenol/oxycodone  Active Problems Tobacco use-smokes half a pack a day, recommend cessation   DVT prophylaxis: SCDs Code Status: Full code Family Communication: No family at bedside Disposition Plan: Home when ready Bed Type: Telemetry Consults called: Hand surgery Obs/Inp: Inpatient per accepting MD Dr. Alanda Slim  At the time of admission, it appears that the appropriate admission status for this patient is INPATIENT as it is expected that patient will require hospital care > 2 midnights. This is judged to be reasonable and necessary in order to provide the required intensity of service to ensure the patient's safety given: presenting symptoms, initial radiographic and laboratory data and in the context of their chronic comorbidities. Together, these circumstances are felt to place patient at high at high risk for further clinical deterioration threatening life, limb, or organ.  Pamella Pert, MD, PhD [facility]  Contact via [email or web address]  01/02/2020, 1:01 PM

## 2020-01-02 NOTE — Consult Note (Signed)
ORTHOPAEDIC CONSULTATION  REQUESTING PHYSICIAN: Leatha Gilding, MD  PCP:  [place], [place]  Chief Complaint: Left hand infection  HPI: Anna Huang is a 44 y.o. female who complains of left hand infection.  Patient sustained an accidental dog bite of her left hand over the weekend.  She was seen at outside urgent care where she was placed on Augmentin.  Over the past several days she has noticed progressive swelling and increasing pain to left hand despite taking her oral antibiotics.  She was subsequently brought to med [facility] where she was found to have significant swelling to her left hand as well as erythema to the dorsum of the hand and extending up the distal forearm.  Hand surgery was consulted for further recommendations.  I did she states she has had no fevers.  She noticed some drainage through the dorsal puncture wound yesterday and today.  She has been n.p.o.  Past Medical History:  Diagnosis Date  . Anxiety   . Colitis   . Dog bite 12/2019   LEFT HAND  . History of kidney stones   . Opioid abuse [facility])    Per family   Past Surgical History:  Procedure Laterality Date  . ABDOMINAL HYSTERECTOMY    . CESAREAN SECTION  02/20/2012   Procedure: CESAREAN SECTION;  Surgeon: Mickel Baas, MD;  Location: [facility];  Service: Obstetrics;  Laterality: N/A;  . CHOLECYSTECTOMY    . DILATION AND CURETTAGE OF UTERUS  2007  . KNEE SURGERY    . OVARIAN CYST REMOVAL    . SHOULDER SURGERY     Social History   Socioeconomic History  . Marital status: Married    Spouse name: Not on file  . Number of children: Not on file  . Years of education: Not on file  . Highest education level: Not on file  Occupational History  . Not on file  Tobacco Use  . Smoking status: Current Every Day Smoker    Types: Cigarettes  . Smokeless tobacco: Never Used  Vaping Use  . Vaping Use: Never used  Substance and Sexual Activity  . Alcohol use: No  . Drug use: Yes      Types: Marijuana  . Sexual activity: Not Currently  Other Topics Concern  . Not on file  Social History Narrative  . Not on file   Social Determinants of Health   Financial Resource Strain:   . Difficulty of Paying Living Expenses: Not on file  Food Insecurity:   . Worried About Programme researcher, broadcasting/film/video in the Last Year: Not on file  . Ran Out of Food in the Last Year: Not on file  Transportation Needs:   . Lack of Transportation (Medical): Not on file  . Lack of Transportation (Non-Medical): Not on file  Physical Activity:   . Days of Exercise per Week: Not on file  . Minutes of Exercise per Session: Not on file  Stress:   . Feeling of Stress : Not on file  Social Connections:   . Frequency of Communication with Friends and Family: Not on file  . Frequency of Social Gatherings with Friends and Family: Not on file  . Attends Religious Services: Not on file  . Active Member of Clubs or Organizations: Not on file  . Attends Banker Meetings: Not on file  . Marital Status: Not on file   Family History  Problem Relation Age of Onset  . Cancer  Maternal Grandmother        Breast  . Stroke Maternal Grandfather   . Heart disease Paternal Grandmother   . Heart disease Paternal Grandfather    Allergies  Allergen Reactions  . Tramadol Nausea Only   Prior to Admission medications   Medication Sig Start Date End Date Taking? Authorizing Provider  ALPRAZolam (XANAX) 0.25 MG tablet Take 0.25 mg by mouth 3 (three) times daily as needed for anxiety.    [provider]  amoxicillin-clavulanate (AUGMENTIN) 875-125 MG tablet Take 1 tablet by mouth 2 (two) times daily.    [provider]  cyclobenzaprine (FLEXERIL) 5 MG tablet Take 5 mg by mouth 3 (three) times daily as needed. For back pain.    [provider]  dicyclomine (BENTYL) 20 MG tablet Take 1 tablet (20 mg total) by mouth 2 (two) times daily. 10/24/16   Garlon Hatchet, PA-C  docusate sodium  (COLACE) 100 MG capsule Take 1 capsule (100 mg total) by mouth 2 (two) times daily. 02/23/12   Waynard Reeds, MD  HYDROcodone-acetaminophen (NORCO/VICODIN) 5-325 MG tablet Take 1 tablet by mouth every 6 (six) hours as needed for moderate pain. 06/02/18   Lawyer, Cristal Deer, PA-C  ibuprofen (ADVIL,MOTRIN) 600 MG tablet Take 1 tablet (600 mg total) by mouth every 6 (six) hours as needed for pain. 02/23/12   Waynard Reeds, MD  ibuprofen (ADVIL,MOTRIN) 800 MG tablet Take 1 tablet (800 mg total) by mouth every 8 (eight) hours as needed. 06/02/18   Lawyer, Cristal Deer, PA-C  ondansetron (ZOFRAN ODT) 4 MG disintegrating tablet Take 1 tablet (4 mg total) by mouth every 8 (eight) hours as needed for nausea. 10/24/16   Garlon Hatchet, PA-C  oxyCODONE-acetaminophen (PERCOCET/ROXICET) 5-325 MG per tablet Take 1 tablet by mouth every 4 (four) hours as needed for pain. 02/28/12   Poe, Deirdre C, CNM  oxyCODONE-acetaminophen (ROXICET) 5-325 MG per tablet Take 2 tablets by mouth every 4 (four) hours as needed for pain. May take 1-2 tablets every 4-6 hours as needed for pain 02/28/12   Archie Patten, CNM  Prenatal Vit-Fe Fumarate-FA (PRENATAL MULTIVITAMIN) TABS Take 1 tablet by mouth daily.    [provider]  traMADol (ULTRAM) 50 MG tablet Take 1 tablet (50 mg total) by mouth every 6 (six) hours as needed for severe pain. 06/02/18   Charlestine Night, PA-C   CT HAND LEFT W CONTRAST  Result Date: 01/02/2020 CLINICAL DATA:  Dog bite to left hand 12/29/2019. Now with redness, swelling, and pain. Infection suspected. EXAM: CT OF THE UPPER LEFT EXTREMITY WITH CONTRAST TECHNIQUE: Multidetector CT imaging of the left hand was performed according to the standard protocol following intravenous contrast administration. CONTRAST:  OMNIPAQUE IOHEXOL 300 MG/ML  SOLN COMPARISON:  X-ray 12/30/2019, 12/18/2016 FINDINGS: Bones/Joint/Cartilage No acute fracture. No dislocation. There is a chronic ununited fracture involving  the distal tuft of the small finger. No bony erosion or periostitis. Joint spaces are maintained without significant arthropathy. No appreciable radiocarpal joint effusion. Ligaments Suboptimally assessed by CT. Muscles and Tendons Tendinous structures appear intact within the limitations of CT. No appreciable tenosynovial fluid collection. Unremarkable muscle bulk. No intramuscular fluid collection. Soft tissues Soft tissue swelling with subcutaneous edema overlying the dorsum of the hand. No organized soft tissue fluid collection. No soft tissue gas. No radiopaque soft tissue foreign body. IMPRESSION: 1. Soft tissue swelling with subcutaneous edema overlying the dorsum of the hand, suggestive of cellulitis. No organized soft tissue fluid collection. No soft tissue gas.  2. No evidence of tenosynovitis. 3. No acute osseous abnormality. 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 Electronically Signed   By: Duanne Guess D.O.   On: 01/02/2020 09:07    Positive ROS: All other systems have been reviewed and were otherwise negative with the exception of those mentioned in the HPI and as above.  Physical Exam: General: Alert, no acute distress Cardiovascular: No edema Respiratory: No cyanosis, no use of accessory musculature Skin: 2 small puncture wounds on the hand Psychiatric: Patient is competent for consent with normal mood and affect  MUSCULOSKELETAL: Examination left hand shows moderately swollen hand and digits.  There is erythema surrounding the dorsum of the hand and extending into the dorsal ulnar forearm distally.  There is a transverse puncture wound over the dorsal and ulnar hand which is significantly tender.  There is some fluctuance to the dorsum of the hand as well which is also very tender.  There is a second puncture wound volarly near the thumb CMC joint.  Some mild surrounding erythema no active drainage is noted.  She has intact gentle digit flexion and  extension as well as gentle wrist flexion extension.  Her fingertips are warm well perfused with brisk capillary refill.  Her sensation is grossly intact light touch throughout all digits.  Assessment: Left hand infection following dog bite over the weekend  Plan: Plan to proceed forward with irrigation debridement of the left hand.  The risks, benefits and alternatives of the procedure were discussed with the patient.  These risks include but are not limited to infection, bleeding, damage to surrounding structures including blood vessels and nerves, pain, stiffness and need for additional procedures.  The left upper extremity was marked and informed consent was obtained.  Patient is admitted to hospital service so we will monitor her over the next 1 to 2days.    Ernest Mallick, MD [phone]   01/02/2020 3:41 PM

## 2020-01-02 NOTE — ED Notes (Signed)
Patient transported to CT 

## 2020-01-02 NOTE — ED Provider Notes (Addendum)
[facility] EMERGENCY DEPARTMENT Provider Note   CSN: [id number] Arrival date & time: 01/02/20  4010     History Chief Complaint  Patient presents with  . Animal Bite    Anna Huang is a 44 y.o. female.  Patient c/o left hand infection due to dog bite. Dog bite occurred 4 days ago, playing w dog, her hand in dogs mouth - dog acting healthy/normal since, and has had all its shots. Day after bite, acute onset pain, swelling, redness - went to urgent care, was placed on augmentin.  Has taken augmentin bid x 3 days with mildly worsening symptoms, increased swelling that has spread proximal to wrist, and increased pain w movement digits, and now pus from wound on dorsum hand.  Initially two puncture wounds, thenar area on palm and dorsum of hand on proximal/ulnar side of hand. No pain, swelling or pus from thenar wound. Tetanus up to date. Right hand dominant.   The history is provided by the patient.  Animal Bite Associated symptoms: no fever and no numbness        Past Medical History:  Diagnosis Date  . Anxiety   . Colitis   . Kidney stone   . Opioid abuse [facility])    Per family    Patient Active Problem List   Diagnosis Date Noted  . Overdose 08/11/2018    Past Surgical History:  Procedure Laterality Date  . ABDOMINAL HYSTERECTOMY    . CESAREAN SECTION  02/20/2012   Procedure: CESAREAN SECTION;  Surgeon: Mickel Baas, MD;  Location: [facility] ORS;  Service: Obstetrics;  Laterality: N/A;  . CHOLECYSTECTOMY    . DILATION AND CURETTAGE OF UTERUS  2007  . KNEE SURGERY    . OVARIAN CYST REMOVAL    . SHOULDER SURGERY       OB History    Gravida  2   Para  1   Term  0   Preterm  1   AB  1   Living  1     SAB  1   TAB  0   Ectopic  0   Multiple  0   Live Births  1           Family History  Problem Relation Age of Onset  . Cancer Maternal Grandmother        Breast  . Stroke Maternal Grandfather   . Heart disease Paternal Grandmother     . Heart disease Paternal Grandfather     Social History   Tobacco Use  . Smoking status: Current Every Day Smoker    Types: Cigarettes  . Smokeless tobacco: Never Used  Vaping Use  . Vaping Use: Never used  Substance Use Topics  . Alcohol use: No  . Drug use: Yes    Types: Marijuana    Home Medications Prior to Admission medications   Medication Sig Start Date End Date Taking? Authorizing Provider  ALPRAZolam (XANAX) 0.25 MG tablet Take 0.25 mg by mouth 3 (three) times daily as needed for anxiety.    [provider]  amoxicillin-clavulanate (AUGMENTIN) 875-125 MG tablet Take 1 tablet by mouth 2 (two) times daily.    [provider]  cyclobenzaprine (FLEXERIL) 5 MG tablet Take 5 mg by mouth 3 (three) times daily as needed. For back pain.    [provider]  dicyclomine (BENTYL) 20 MG tablet Take 1 tablet (20 mg total) by mouth 2 (two) times daily. 10/24/16   Garlon Hatchet,  PA-C  docusate sodium (COLACE) 100 MG capsule Take 1 capsule (100 mg total) by mouth 2 (two) times daily. 02/23/12   Waynard Reedsoss, Kendra, MD  HYDROcodone-acetaminophen (NORCO/VICODIN) 5-325 MG tablet Take 1 tablet by mouth every 6 (six) hours as needed for moderate pain. 06/02/18   Lawyer, Cristal Deerhristopher, PA-C  ibuprofen (ADVIL,MOTRIN) 600 MG tablet Take 1 tablet (600 mg total) by mouth every 6 (six) hours as needed for pain. 02/23/12   Waynard Reedsoss, Kendra, MD  ibuprofen (ADVIL,MOTRIN) 800 MG tablet Take 1 tablet (800 mg total) by mouth every 8 (eight) hours as needed. 06/02/18   Lawyer, Cristal Deerhristopher, PA-C  ondansetron (ZOFRAN ODT) 4 MG disintegrating tablet Take 1 tablet (4 mg total) by mouth every 8 (eight) hours as needed for nausea. 10/24/16   Garlon HatchetSanders, Lisa M, PA-C  oxyCODONE-acetaminophen (PERCOCET/ROXICET) 5-325 MG per tablet Take 1 tablet by mouth every 4 (four) hours as needed for pain. 02/28/12   Poe, Deirdre C, CNM  oxyCODONE-acetaminophen (ROXICET) 5-325 MG per tablet Take 2 tablets by mouth every 4  (four) hours as needed for pain. May take 1-2 tablets every 4-6 hours as needed for pain 02/28/12   Archie PattenFrazier, Natalie K, CNM  Prenatal Vit-Fe Fumarate-FA (PRENATAL MULTIVITAMIN) TABS Take 1 tablet by mouth daily.    [provider]  traMADol (ULTRAM) 50 MG tablet Take 1 tablet (50 mg total) by mouth every 6 (six) hours as needed for severe pain. 06/02/18   Lawyer, Cristal Deerhristopher, PA-C    Allergies    Tramadol  Review of Systems   Review of Systems  Constitutional: Negative for chills and fever.  Eyes: Negative for redness.  Respiratory: Negative for shortness of breath.   Cardiovascular: Negative for chest pain.  Gastrointestinal: Negative for nausea and vomiting.  Genitourinary: Negative for flank pain.  Musculoskeletal: Negative for arthralgias.  Skin: Positive for wound.  Neurological: Negative for weakness and numbness.  Hematological: Negative for adenopathy.  Psychiatric/Behavioral: Negative for confusion.    Physical Exam Updated Vital Signs BP 117/73 (BP Location: Right Arm)   Pulse (!) 102   Temp 98.7 F (37.1 C) (Oral)   Resp (!) 22   Ht 1.651 m (5\' 5" )   Wt 80.3 kg   LMP 12/11/2016   SpO2 100%   BMI 29.45 kg/m   Physical Exam Vitals and nursing note reviewed.  Constitutional:      Appearance: Normal appearance. She is well-developed.  HENT:     Head: Atraumatic.     Nose: Nose normal.     Mouth/Throat:     Mouth: Mucous membranes are moist.  Eyes:     General: No scleral icterus.    Conjunctiva/sclera: Conjunctivae normal.  Neck:     Trachea: No tracheal deviation.  Cardiovascular:     Rate and Rhythm: Normal rate.     Pulses: Normal pulses.  Pulmonary:     Effort: Pulmonary effort is normal.  Abdominal:     General: There is no distension.  Genitourinary:    Comments: No cva tenderness.  Musculoskeletal:     Cervical back: Normal range of motion and neck supple. No muscular tenderness.     Comments: Moderate swelling to dorsum left hand  extending from mid fingers to just proximal to wrist. Small wound on dorsum of left hand draining pus. Marked pain w any movement of middle, ring, or small finger. Mild erythema and increased warmth to dorsum left hand. Wound on palmer side with minimal erythema, no pus or exquisite tenderness. No lymphangitis. No  elbow or axillary l/a.   Skin:    General: Skin is warm and dry.     Findings: No rash.  Neurological:     Mental Status: She is alert.     Comments: Alert, speech normal. Left hand nvi, with intact motor/sens function.   Psychiatric:        Mood and Affect: Mood normal.     ED Results / Procedures / Treatments   Labs (all labs ordered are listed, but only abnormal results are displayed)  Results for orders placed or performed during the hospital encounter of 01/02/20  CBC with Differential/Platelet  Result Value Ref Range   WBC 13.0 (H) 4.0 - 10.5 K/uL   RBC 4.65 3.87 - 5.11 MIL/uL   Hemoglobin 14.3 12.0 - 15.0 g/dL   HCT 44.3 36 - 46 %   MCV 90.8 80.0 - 100.0 fL   MCH 30.8 26.0 - 34.0 pg   MCHC 33.9 30.0 - 36.0 g/dL   RDW 15.4 00.8 - 67.6 %   Platelets 311 150 - 400 K/uL   nRBC 0.0 0.0 - 0.2 %   Neutrophils Relative % 79 %   Neutro Abs 10.2 (H) 1.7 - 7.7 K/uL   Lymphocytes Relative 15 %   Lymphs Abs 2.0 0.7 - 4.0 K/uL   Monocytes Relative 5 %   Monocytes Absolute 0.7 0 - 1 K/uL   Eosinophils Relative 0 %   Eosinophils Absolute 0.0 0 - 0 K/uL   Basophils Relative 0 %   Basophils Absolute 0.0 0 - 0 K/uL   Immature Granulocytes 1 %   Abs Immature Granulocytes 0.06 0.00 - 0.07 K/uL             EKG None  Radiology CT HAND LEFT W CONTRAST  Result Date: 01/02/2020 CLINICAL DATA:  Dog bite to left hand 12/29/2019. Now with redness, swelling, and pain. Infection suspected. EXAM: CT OF THE UPPER LEFT EXTREMITY WITH CONTRAST TECHNIQUE: Multidetector CT imaging of the left hand was performed according to the standard protocol following intravenous contrast  administration. CONTRAST:  OMNIPAQUE IOHEXOL 300 MG/ML  SOLN COMPARISON:  X-ray 12/30/2019, 12/18/2016 FINDINGS: Bones/Joint/Cartilage No acute fracture. No dislocation. 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 No bony erosion or periostitis. Joint spaces are maintained without significant arthropathy. No appreciable radiocarpal joint effusion. Ligaments Suboptimally assessed by CT. Muscles and Tendons Tendinous structures appear intact within the limitations of CT. No appreciable tenosynovial fluid collection. Unremarkable muscle bulk. No intramuscular fluid collection. Soft tissues Soft tissue swelling with subcutaneous edema overlying the dorsum of the hand. No organized soft tissue fluid collection. No soft tissue gas. No radiopaque soft tissue foreign body. IMPRESSION: 1. Soft tissue swelling with subcutaneous edema overlying the dorsum of the hand, suggestive of cellulitis. No organized soft tissue fluid collection. No soft tissue gas. 2. No evidence of tenosynovitis. 3. No acute osseous abnormality. 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 Electronically Signed   By: Duanne Guess D.O.   On: 01/02/2020 09:07    Procedures Procedures (including critical care time)  Medications Ordered in ED Medications  piperacillin-tazobactam (ZOSYN) IVPB 3.375 g (has no administration in time range)    ED Course  I have reviewed the triage vital signs and the nursing notes.  Pertinent labs & imaging results that were available during my care of the patient were reviewed by me and considered in my medical decision making (see chart  for details).    MDM Rules/Calculators/A&P                         Iv ns. Zosyn iv. Labs sent. Hand surgery consulted.  Reviewed nursing notes and prior charts for additional history.  Recent urgent care visit, 10/4,  reviewed - xrays then negative for fracture or foreign body.   Discussed pt  with hand surgery on call, Dr Roney Mans, he indicates get CT hand w contrast, transfer to [facility], admission to medicine service there, IV antibiotic tx, and he will plan to wash out later today.   CT ordered.   Will discuss with hospitalist at [facility] for admission.   Labs reviewed/interpreted by me - wbc elevated.   CT reviewed/interpreted by me - c/w hand infection.   Discussed with hospitalists, Dr Hansel Feinstein - he accepts patient, will admit, and requests we add vancomycin to antibiotic tx -  will transport to [facility] for admission, planned surgery this afternoon.        Final Clinical Impression(s) / ED Diagnoses Final diagnoses:  None    Rx / DC Orders ED Discharge Orders    None           Cathren Laine, MD 01/02/20 [phone]

## 2020-01-02 NOTE — Progress Notes (Signed)
Pharmacy Antibiotic Note  Anna Huang is a 44 y.o. female admitted on 01/02/2020 with L hand  cellulitis from a dog-bite on augmentin at home but symptoms not improving  Pharmacy has been consulted for zosyn dosing.  Plan: -Zosyn 3.375gm IV q8h -Will follow renal function, cultures and clinical progress   Height: 5\' 5"  (165.1 cm) Weight: 81.5 kg (179 lb 9.6 oz) IBW/kg (Calculated) : 57  Temp (24hrs), Avg:98.6 F (37 C), Min:98.5 F (36.9 C), Max:98.7 F (37.1 C)  Recent Labs  Lab 01/02/20 0758 01/02/20 0954  WBC 13.0*  --   CREATININE 0.75  --   LATICACIDVEN  --  0.7    Estimated Creatinine Clearance: 95.6 mL/min (by C-G formula based on SCr of 0.75 mg/dL).    Allergies  Allergen Reactions  . Tramadol Nausea Only    Thank you for allowing pharmacy to be a part of this patient's care.  03/03/20, PharmD Clinical Pharmacist **Pharmacist phone directory can now be found on [email or web address] ([email or web address]).  Listed under [facility].

## 2020-01-02 NOTE — ED Notes (Signed)
Anna Huang informed pt request pain medication for L hand pain.

## 2020-01-02 NOTE — ED Notes (Signed)
Last food intake 01/01/2020 at 1800, last fluid intake 01/02/2020 at 0630.

## 2020-01-02 NOTE — Anesthesia Procedure Notes (Signed)
Anesthesia Regional Block: Supraclavicular block   Pre-Anesthetic Checklist: ,, timeout performed, Correct Patient, Correct Site, Correct Laterality, Correct Procedure, Correct Position, site marked, Risks and benefits discussed,  Surgical consent,  Pre-op evaluation,  At surgeon's request and post-op pain management  Laterality: Left  Prep: Maximum Sterile Barrier Precautions used, chloraprep       Needles:  Injection technique: Single-shot  Needle Type: Echogenic Stimulator Needle     Needle Length: 9cm  Needle Gauge: 22     Additional Needles:   Procedures:,,,, ultrasound used (permanent image in chart),,,,  Narrative:  Start time: 01/02/2020 3:50 PM End time: 01/02/2020 3:55 PM Injection made incrementally with aspirations every 5 mL.  Performed by: Personally  Anesthesiologist: Lannie Fields, DO  Additional Notes: Monitors applied. No increased pain on injection. No increased resistance to injection. Injection made in 5cc increments. Good needle visualization. Patient tolerated procedure well.

## 2020-01-02 NOTE — ED Triage Notes (Addendum)
Dog bite on Sunday , saw [facility] and given antiobiotics and xray but hand cont to swell and has drainage, dog has had all shots

## 2020-01-02 NOTE — Anesthesia Preprocedure Evaluation (Signed)
Anesthesia Evaluation  Patient identified by MRN, date of birth, ID band  Reviewed: Allergy & Precautions, NPO status , Patient's Chart, lab work & pertinent test results  Airway Mallampati: II  TM Distance: >3 FB Neck ROM: Full    Dental no notable dental hx. (+) Teeth Intact, Dental Advisory Given   Pulmonary Current Smoker,  1/2 ppd    Pulmonary exam normal breath sounds clear to auscultation       Cardiovascular negative cardio ROS Normal cardiovascular exam Rhythm:Regular Rate:Normal     Neuro/Psych PSYCHIATRIC DISORDERS Anxiety negative neurological ROS     GI/Hepatic negative GI ROS, (+)     substance abuse (in past, not recently)  ,   Endo/Other  negative endocrine ROS  Renal/GU negative Renal ROS  negative genitourinary   Musculoskeletal negative musculoskeletal ROS (+)   Abdominal   Peds  Hematology negative hematology ROS (+)   Anesthesia Other Findings   Reproductive/Obstetrics negative OB ROS                             Anesthesia Physical Anesthesia Plan  ASA: II  Anesthesia Plan: Regional and MAC   Post-op Pain Management:    Induction: Intravenous  PONV Risk Score and Plan: 1 and Ondansetron, Dexamethasone, Midazolam and Treatment may vary due to age or medical condition  Airway Management Planned: Natural Airway and Nasal Cannula  Additional Equipment: None  Intra-op Plan:   Post-operative Plan:   Informed Consent: I have reviewed the patients History and Physical, chart, labs and discussed the procedure including the risks, benefits and alternatives for the proposed anesthesia with the patient or authorized representative who has indicated his/her understanding and acceptance.     Dental advisory given  Plan Discussed with: CRNA  Anesthesia Plan Comments:         Anesthesia Quick Evaluation

## 2020-01-02 NOTE — Anesthesia Procedure Notes (Signed)
Procedure Name: MAC Date/Time: 01/02/2020 4:14 PM Performed by: Janene Harvey, CRNA Pre-anesthesia Checklist: Patient identified, Emergency Drugs available, Suction available and Patient being monitored Oxygen Delivery Method: Simple face mask Placement Confirmation: positive ETCO2 Dental Injury: Teeth and Oropharynx as per pre-operative assessment

## 2020-01-03 ENCOUNTER — Encounter (HOSPITAL_COMMUNITY): Payer: Self-pay | Admitting: Orthopaedic Surgery

## 2020-01-03 DIAGNOSIS — L03114 Cellulitis of left upper limb: Secondary | ICD-10-CM

## 2020-01-03 DIAGNOSIS — W540XXA Bitten by dog, initial encounter: Secondary | ICD-10-CM | POA: Diagnosis not present

## 2020-01-03 LAB — COMPREHENSIVE METABOLIC PANEL
ALT: 21 U/L (ref 0–44)
AST: 18 U/L (ref 15–41)
Albumin: 3.5 g/dL (ref 3.5–5.0)
Alkaline Phosphatase: 75 U/L (ref 38–126)
Anion gap: 9 (ref 5–15)
BUN: 11 mg/dL (ref 6–20)
CO2: 22 mmol/L (ref 22–32)
Calcium: 9.1 mg/dL (ref 8.9–10.3)
Chloride: 106 mmol/L (ref 98–111)
Creatinine, Ser: 0.8 mg/dL (ref 0.44–1.00)
GFR calc non Af Amer: >60 mL/min (ref >60)
Glucose, Bld: 138 mg/dL — ABNORMAL HIGH (ref 70–99)
Potassium: 3.9 mmol/L (ref 3.5–5.1)
Sodium: 137 mmol/L (ref 135–145)
Total Bilirubin: 0.8 mg/dL (ref 0.3–1.2)
Total Protein: 6.4 g/dL — ABNORMAL LOW (ref 6.5–8.1)

## 2020-01-03 LAB — CBC
HCT: 39.8 % (ref 36.0–46.0)
Hemoglobin: 13.7 g/dL (ref 12.0–15.0)
MCH: 30.7 pg (ref 26.0–34.0)
MCHC: 34.4 g/dL (ref 30.0–36.0)
MCV: 89.2 fL (ref 80.0–100.0)
Platelets: 298 10*3/uL (ref 150–400)
RBC: 4.46 MIL/uL (ref 3.87–5.11)
RDW: 12.4 % (ref 11.5–15.5)
WBC: 10.9 10*3/uL — ABNORMAL HIGH (ref 4.0–10.5)
nRBC: 0 % (ref 0.0–0.2)

## 2020-01-03 MED ORDER — ACETAMINOPHEN 325 MG PO TABS: 650.0000 mg | ORAL_TABLET | Freq: Four times a day (QID) | ORAL | Status: AC | PRN

## 2020-01-03 MED ORDER — OXYCODONE HCL 5 MG PO TABS
5.0000 mg | ORAL_TABLET | Freq: Four times a day (QID) | ORAL | 0 refills | Status: DC | PRN
Start: 2020-01-03 — End: 2020-01-03

## 2020-01-03 MED ORDER — METRONIDAZOLE 500 MG PO TABS: 500.0000 mg | ORAL_TABLET | Freq: Three times a day (TID) | ORAL | 0 refills | Status: DC

## 2020-01-03 MED ORDER — METRONIDAZOLE 500 MG PO TABS: 500.0000 mg | ORAL_TABLET | Freq: Three times a day (TID) | ORAL | 0 refills | Status: AC

## 2020-01-03 MED ORDER — IBUPROFEN 400 MG PO TABS: 400.0000 mg | ORAL_TABLET | Freq: Three times a day (TID) | ORAL | 0 refills | Status: DC | PRN

## 2020-01-03 MED ORDER — SULFAMETHOXAZOLE-TRIMETHOPRIM 800-160 MG PO TABS: 1.0000 | ORAL_TABLET | Freq: Two times a day (BID) | ORAL | 0 refills | Status: AC

## 2020-01-03 MED ORDER — METRONIDAZOLE 500 MG PO TABS
500.0000 mg | ORAL_TABLET | Freq: Three times a day (TID) | ORAL | Status: DC
  Administered 2020-01-03: 500 mg via ORAL
  Filled 2020-01-03: qty 1

## 2020-01-03 MED ORDER — SULFAMETHOXAZOLE-TRIMETHOPRIM 800-160 MG PO TABS
1.0000 | ORAL_TABLET | Freq: Two times a day (BID) | ORAL | Status: DC
  Administered 2020-01-03: 1 via ORAL
  Filled 2020-01-03 (×2): qty 1

## 2020-01-03 MED ORDER — IBUPROFEN 400 MG PO TABS: 400.0000 mg | ORAL_TABLET | Freq: Three times a day (TID) | ORAL | 0 refills | Status: AC | PRN

## 2020-01-03 MED ORDER — OXYCODONE HCL 5 MG PO TABS
5.0000 mg | ORAL_TABLET | Freq: Four times a day (QID) | ORAL | 0 refills | Status: AC | PRN
Start: 2020-01-03 — End: ?

## 2020-01-03 MED ORDER — SULFAMETHOXAZOLE-TRIMETHOPRIM 800-160 MG PO TABS: 1.0000 | ORAL_TABLET | Freq: Two times a day (BID) | ORAL | 0 refills | Status: DC

## 2020-01-03 NOTE — Progress Notes (Signed)
   Ortho Hand Progress Note  Subjective: No acute events last night. Block starting to wear off.   Objective: Vital signs in last 24 hours: Temp:  [97.1 F (36.2 C)-98.8 F (37.1 C)] 98.2 F (36.8 C) (10/08 0045) Pulse Rate:  [72-104] 99 (10/08 0045) Resp:  [12-22] 16 (10/07 2123) BP: (96-121)/(57-88) 98/57 (10/08 0045) SpO2:  [96 %-100 %] 96 % (10/08 0045) Weight:  [80.3 kg-81.5 kg] 81.5 kg (10/07 1247)  Intake/Output from previous day: 10/07 0701 - 10/08 0700 In: 1168.1 [P.O.:360; I.V.:549.1; IV Piggyback:259] Out: 10 [Blood:10] Intake/Output this shift: No intake/output data recorded.  Recent Labs    01/02/20 0758 01/03/20 0215  HGB 14.3 13.7   Recent Labs    01/02/20 0758 01/03/20 0215  WBC 13.0* 10.9*  RBC 4.65 4.46  HCT 42.2 39.8  PLT 311 298   Recent Labs    01/02/20 0758 01/03/20 0215  NA 138 137  K 4.2 3.9  CL 108 106  CO2 24 22  BUN 14 11  CREATININE 0.75 0.80  GLUCOSE 95 138*  CALCIUM 9.7 9.1   No results for input(s): LABPT, INR in the last 72 hours.  Aaox3 nad Resp nonlabored RRR LUE: left hand with decreased erythema. Dorsal left hand incision c/d/i with serosang output from the small, initial laceration. Thenar laceration also with minimal serosang output. No purulence. Packing in place. Intact gentle digit and wrist motion but strength limited secondary to residual block. Fingers wwp with bcr. SILT to fingers.    Assessment/Plan: Left hand infection after dog bite s/p L hand I&D, POD1 - Medicine primary. Appreciate management - Packing pulled this AM - Continue daily dressing changes  - abx per primary - cxs pending - Encourage LUE elevation and digit ROM  OK for d/c home from hand standpoint once abx plan has been determined as outpatient. Follow up with me next week   Cain Saupe III 01/03/2020, 7:28 AM  [phone]

## 2020-01-03 NOTE — Anesthesia Postprocedure Evaluation (Signed)
Anesthesia Post Note  Patient: Anna Huang  Procedure(s) Performed: IRRIGATION AND DEBRIDEMENT LEFT HAND (Left )     Patient location during evaluation: PACU Anesthesia Type: Regional and MAC Level of consciousness: awake and alert Pain management: pain level controlled Vital Signs Assessment: post-procedure vital signs reviewed and stable Respiratory status: spontaneous breathing, nonlabored ventilation and respiratory function stable Cardiovascular status: blood pressure returned to baseline and stable Postop Assessment: no apparent nausea or vomiting Anesthetic complications: no   No complications documented.  Last Vitals:  Vitals:   01/02/20 2123 01/03/20 0045  BP: 115/68 (!) 98/57  Pulse: 100 99  Resp: 16   Temp: 37.1 C 36.8 C  SpO2:  96%    Last Pain:  Vitals:   01/03/20 0045  TempSrc: Oral  PainSc:                  Pervis Hocking

## 2020-01-03 NOTE — Discharge Summary (Signed)
Physician Discharge Summary  Anna Huang QDI:[id number] DOB: April 25, 1975 DOA: 01/02/2020  PCP: Burnis Medin, PA-C  Admit date: 01/02/2020 Discharge date: 01/03/2020  Admitted From: home  Disposition:  Home   Recommendations for Outpatient Follow-up and new medication changes:  1. Follow up with Primary Care in 7 days.  2. Patient will continue antibiotic therapy with metronidazole and bactrim.  3. Follow up with orthopedic surgery in 7 days.  4. Pain control with acetaminophen, ibuprofen and oxycodone.  5. Final cultures still pending.   Home Health: na   Equipment/Devices: na    Discharge Condition: stable  CODE STATUS: full  Diet recommendation: regular.   Brief/Interim Summary: Patient admitted to the hospital with working diagnosis of left hand cellulitis secondary to dog bite.   44 year old female with no significant past medical history who presented with left hand edema after a injury produced by a dog bite. Lesion occurred about 4 days prior to hospitalization. She was seen as an outpatient and received Augmentin. Her symptoms have been worsening despite oral antibiotic therapy for 3 days. Denied any fevers or chills. On her initial physical examination blood pressure 102/69, heart rate 82, respiratory rate 16, temperature 98.5, oxygen saturation 100%. She had moist mucous membranes, lungs clear to auscultation, heart S1-S2, present rhythm, soft abdomen, no lower extremity edema, left hand edema, puncture site present. Sodium 138, potassium 4.2, chloride 108, bicarb 24, glucose 95, BUN 14, creatinine 0.75, white count 13.0, hemoglobin 14.3, hematocrit 42.2, platelets 311. SARS COVID-19 negative. Left hand CT with subcutaneous edema, overlying dorsum of the hand, suggestive of cellulitis. No organized soft tissue fluid collection. No soft tissue gas. No evidence of tenosynovitis or acute osseous abnormality.  Patient was placed on broad-spectrum antibiotic therapy (Zosyn),  pain control and orthopedics was consulted. Patient underwent I&D of a left infected tender puncture wound, irrigation and excisional debridement of left dorsal hand puncture wound, extensor tenosynovectomy of the left EDC, EDQ P and ECU tendons of the dorsum of the hand.    Discharge Diagnoses:  Principal Problem:   Cellulitis of left hand Active Problems:   Dog bite  Patient ruled out for sepsis.   1. Acute left hand cellulitis due dog bite. Patient responded well to antibiotic and surgical intervention. Her wbc is down to 10,9 and cultures are with no growth. Orthopedic will follow up with patient in 7 days, and recommended daily dressing changes. Transition to oral antibiotic therapy with metronidazole and bactrim for 10 days.  Patient will follow up with primary care in 7 days. Continue pain control with acetaminophen, ibuprofen and oxycodone.   Encourage left upper extremity elevation and digit range of motion.   2. Overweight. BMI is 29,8. Will need outpatient follow up.   3. Tobacco use. Smoking cessation recommendations.   Discharge Instructions   Allergies as of 01/03/2020      Reactions   Tramadol Nausea Only      Medication List    STOP taking these medications   dicyclomine 20 MG tablet Commonly known as: BENTYL   docusate sodium 100 MG capsule Commonly known as: Colace   HYDROcodone-acetaminophen 5-325 MG tablet Commonly known as: NORCO/VICODIN   ondansetron 4 MG disintegrating tablet Commonly known as: Zofran ODT   oxyCODONE-acetaminophen 5-325 MG tablet Commonly known as: Roxicet   traMADol 50 MG tablet Commonly known as: ULTRAM     TAKE these medications   acetaminophen 325 MG tablet Commonly known as: TYLENOL Take 2 tablets (650 mg total) by  mouth every 6 (six) hours as needed for mild pain (or Fever >/= 101). What changed:   medication strength  how much to take  reasons to take this   ibuprofen 400 MG tablet Commonly known as:  ADVIL Take 1 tablet (400 mg total) by mouth every 8 (eight) hours as needed for moderate pain. What changed:   medication strength  how much to take  when to take this  reasons to take this  Another medication with the same name was removed. Continue taking this medication, and follow the directions you see here.   metroNIDAZOLE 500 MG tablet Commonly known as: FLAGYL Take 1 tablet (500 mg total) by mouth every 8 (eight) hours for 10 days.   oxyCODONE 5 MG immediate release tablet Commonly known as: Oxy IR/ROXICODONE Take 1 tablet (5 mg total) by mouth every 6 (six) hours as needed for severe pain or breakthrough pain.   sulfamethoxazole-trimethoprim 800-160 MG tablet Commonly known as: BACTRIM DS Take 1 tablet by mouth every 12 (twelve) hours for 10 days.            Discharge Care Instructions  (From admission, onward)         Start     Ordered   01/03/20 0000  Discharge wound care:       Comments: Please do daily dressing changes, apply 4x4 and ACE wrap.   01/03/20 1228          Allergies  Allergen Reactions   Tramadol Nausea Only    Consultations:  Orthopedics   Procedures/Studies: CT HAND LEFT W CONTRAST  Result Date: 01/02/2020 CLINICAL DATA:  Dog bite to left hand 12/29/2019. Now with redness, swelling, and pain. Infection suspected. EXAM: CT OF THE UPPER LEFT EXTREMITY WITH CONTRAST TECHNIQUE: Multidetector CT imaging of the left hand was performed according to the standard protocol following intravenous contrast administration. CONTRAST:  OMNIPAQUE IOHEXOL 300 MG/ML  SOLN COMPARISON:  X-ray 12/30/2019, 12/18/2016 FINDINGS: Bones/Joint/Cartilage No acute fracture. No dislocation. 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 No bony erosion or periostitis. Joint spaces are maintained without significant arthropathy. No appreciable radiocarpal joint effusion. Ligaments Suboptimally assessed by CT. Muscles and  Tendons Tendinous structures appear intact within the limitations of CT. No appreciable tenosynovial fluid collection. Unremarkable muscle bulk. No intramuscular fluid collection. Soft tissues Soft tissue swelling with subcutaneous edema overlying the dorsum of the hand. No organized soft tissue fluid collection. No soft tissue gas. No radiopaque soft tissue foreign body. IMPRESSION: 1. Soft tissue swelling with subcutaneous edema overlying the dorsum of the hand, suggestive of cellulitis. No organized soft tissue fluid collection. No soft tissue gas. 2. No evidence of tenosynovitis. 3. No acute osseous abnormality. 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 Electronically Signed   By: Duanne Guess D.O.   On: 01/02/2020 09:07      Procedures:  Patient underwent I&D of a left infected tender puncture wound, irrigation and excisional debridement of left dorsal hand puncture wound, extensor tenosynovectomy of the left EDC, EDQ P and ECU tendons of the dorsum of the hand.  Subjective: Patient is feeling better, her left hand pain and edema have improved, now she has increase mobility of her fingers. No nausea or vomiting, no chest pain or dyspnea.   Discharge Exam: Vitals:   01/03/20 0045 01/03/20 1114  BP: (!) 98/57 125/75  Pulse: 99 98  Resp:  20  Temp: 98.2 F (36.8  C) 98.5 F (36.9 C)  SpO2: 96% 98%   Vitals:   01/02/20 1752 01/02/20 2123 01/03/20 0045 01/03/20 1114  BP: 96/70 115/68 (!) 98/57 125/75  Pulse: 83 100 99 98  Resp: 16 16  20   Temp: 97.9 F (36.6 C) 98.8 F (37.1 C) 98.2 F (36.8 C) 98.5 F (36.9 C)  TempSrc: Oral Oral Oral Oral  SpO2: 98%  96% 98%  Weight:      Height:        General: Not in pain or dyspnea,  Neurology: Awake and alert, non focal  E ENT: no pallor, no icterus, oral mucosa moist Cardiovascular: No JVD. S1-S2 present, rhythmic, no gallops, rubs, or murmurs. No lower extremity edema. Pulmonary: vesicular breath  sounds bilaterally Gastrointestinal. Abdomen soft and non tender Skin. Left hand with dressing in place. Positive edema at the exposed fingers.  Musculoskeletal: no joint deformities   The results of significant diagnostics from this hospitalization (including imaging, microbiology, ancillary and laboratory) are listed below for reference.     Microbiology: Recent Results (from the past 240 hour(s))  Respiratory Panel by RT PCR (Flu A&B, Covid) - Nasopharyngeal Swab     Status: None   Collection Time: 01/02/20  8:34 AM   Specimen: Nasopharyngeal Swab  Result Value Ref Range Status   SARS Coronavirus 2 by RT PCR NEGATIVE NEGATIVE Final    Comment: (NOTE) SARS-CoV-2 target nucleic acids are NOT DETECTED.  The SARS-CoV-2 RNA is generally detectable in upper respiratoy specimens during the acute phase of infection. The lowest concentration of SARS-CoV-2 viral copies this assay can detect is 131 copies/mL. A negative result does not preclude SARS-Cov-2 infection and should not be used as the sole basis for treatment or other patient management decisions. A negative result may occur with  improper specimen collection/handling, submission of specimen other than nasopharyngeal swab, presence of viral mutation(s) within the areas targeted by this assay, and inadequate number of viral copies (<131 copies/mL). A negative result must be combined with clinical observations, patient history, and epidemiological information. The expected result is Negative.  Fact Sheet for Patients:  03/03/20  Fact Sheet for Healthcare Providers:  https://www.moore.com/  This test is no t yet approved or cleared by the https://www.young.biz/ FDA and  has been authorized for detection and/or diagnosis of SARS-CoV-2 by FDA under an Emergency Use Authorization (EUA). This EUA will remain  in effect (meaning this test can be used) for the duration of the COVID-19  declaration under Section 564(b)(1) of the Act, 21 U.S.C. section 360bbb-3(b)(1), unless the authorization is terminated or revoked sooner.     Influenza A by PCR NEGATIVE NEGATIVE Final   Influenza B by PCR NEGATIVE NEGATIVE Final    Comment: (NOTE) The Xpert Xpress SARS-CoV-2/FLU/RSV assay is intended as an aid in  the diagnosis of influenza from Nasopharyngeal swab specimens and  should not be used as a sole basis for treatment. Nasal washings and  aspirates are unacceptable for Xpert Xpress SARS-CoV-2/FLU/RSV  testing.  Fact Sheet for Patients: [place]  Fact Sheet for Healthcare Providers: https://www.moore.com/  This test is not yet approved or cleared by the https://www.young.biz/ FDA and  has been authorized for detection and/or diagnosis of SARS-CoV-2 by  FDA under an Emergency Use Authorization (EUA). This EUA will remain  in effect (meaning this test can be used) for the duration of the  Covid-19 declaration under Section 564(b)(1) of the Act, 21  U.S.C. section 360bbb-3(b)(1), unless the authorization is  terminated  or revoked. Performed at [facility], [address] [place], [place] [address]   Surgical pcr screen     Status: None   Collection Time: 01/02/20 12:58 PM   Specimen: Nasal Mucosa; Nasal Swab  Result Value Ref Range Status   MRSA, PCR NEGATIVE NEGATIVE Final   Staphylococcus aureus NEGATIVE NEGATIVE Final    Comment: (NOTE) The Xpert SA Assay (FDA approved for NASAL specimens in patients 79 years of age and older), is one component of a comprehensive surveillance program. It is not intended to diagnose infection nor to guide or monitor treatment. Performed at [facility], [address], [place], [place] [address]   Aerobic/Anaerobic Culture (surgical/deep wound)     Status: None (Preliminary result)   Collection Time: 01/02/20  4:29 PM   Specimen: Wound; Abscess  Result Value Ref  Range Status   Specimen Description WOUND LEFT HAND  Final   Special Requests DORSAL  Final   Gram Stain   Final    RARE WBC PRESENT,BOTH PMN AND MONONUCLEAR NO ORGANISMS SEEN    Culture   Final    NO GROWTH < 24 HOURS Performed at [facility], [address], [place], [place] [address]    Report Status PENDING  Incomplete     Labs: BNP (last 3 results) No results for input(s): BNP in the last 8760 hours. Basic Metabolic Panel: Recent Labs  Lab 01/02/20 0758 01/03/20 0215  NA 138 137  K 4.2 3.9  CL 108 106  CO2 24 22  GLUCOSE 95 138*  BUN 14 11  CREATININE 0.75 0.80  CALCIUM 9.7 9.1   Liver Function Tests: Recent Labs  Lab 01/03/20 0215  AST 18  ALT 21  ALKPHOS 75  BILITOT 0.8  PROT 6.4*  ALBUMIN 3.5   No results for input(s): LIPASE, AMYLASE in the last 168 hours. No results for input(s): AMMONIA in the last 168 hours. CBC: Recent Labs  Lab 01/02/20 0758 01/03/20 0215  WBC 13.0* 10.9*  NEUTROABS 10.2*  --   HGB 14.3 13.7  HCT 42.2 39.8  MCV 90.8 89.2  PLT 311 298   Cardiac Enzymes: No results for input(s): CKTOTAL, CKMB, CKMBINDEX, TROPONINI in the last 168 hours. BNP: Invalid input(s): POCBNP CBG: No results for input(s): GLUCAP in the last 168 hours. D-Dimer No results for input(s): DDIMER in the last 72 hours. Hgb A1c No results for input(s): HGBA1C in the last 72 hours. Lipid Profile No results for input(s): CHOL, HDL, LDLCALC, TRIG, CHOLHDL, LDLDIRECT in the last 72 hours. Thyroid function studies No results for input(s): TSH, T4TOTAL, T3FREE, THYROIDAB in the last 72 hours.  Invalid input(s): FREET3 Anemia work up No results for input(s): VITAMINB12, FOLATE, FERRITIN, TIBC, IRON, RETICCTPCT in the last 72 hours. Urinalysis    Component Value Date/Time   COLORURINE YELLOW 08/11/2018 0059   APPEARANCEUR CLEAR 08/11/2018 0059   LABSPEC >1.030 (H) 08/11/2018 0059   PHURINE 6.0 08/11/2018 0059   GLUCOSEU >=500 (A) 08/11/2018  0059   HGBUR NEGATIVE 08/11/2018 0059   BILIRUBINUR NEGATIVE 08/11/2018 0059   KETONESUR NEGATIVE 08/11/2018 0059   PROTEINUR 30 (A) 08/11/2018 0059   UROBILINOGEN 0.2 02/13/2012 2140   NITRITE NEGATIVE 08/11/2018 0059   LEUKOCYTESUR NEGATIVE 08/11/2018 0059   Sepsis Labs Invalid input(s): PROCALCITONIN,  WBC,  LACTICIDVEN Microbiology Recent Results (from the past 240 hour(s))  Respiratory Panel by RT PCR (Flu A&B, Covid) - Nasopharyngeal Swab     Status:  None   Collection Time: 01/02/20  8:34 AM   Specimen: Nasopharyngeal Swab  Result Value Ref Range Status   SARS Coronavirus 2 by RT PCR NEGATIVE NEGATIVE Final    Comment: (NOTE) SARS-CoV-2 target nucleic acids are NOT DETECTED.  The SARS-CoV-2 RNA is generally detectable in upper respiratoy specimens during the acute phase of infection. The lowest concentration of SARS-CoV-2 viral copies this assay can detect is 131 copies/mL. A negative result does not preclude SARS-Cov-2 infection and should not be used as the sole basis for treatment or other patient management decisions. A negative result may occur with  improper specimen collection/handling, submission of specimen other than nasopharyngeal swab, presence of viral mutation(s) within the areas targeted by this assay, and inadequate number of viral copies (<131 copies/mL). A negative result must be combined with clinical observations, patient history, and epidemiological information. The expected result is Negative.  Fact Sheet for Patients:  https://www.moore.com/  Fact Sheet for Healthcare Providers:  https://www.young.biz/  This test is no t yet approved or cleared by the [place] FDA and  has been authorized for detection and/or diagnosis of SARS-CoV-2 by FDA under an Emergency Use Authorization (EUA). This EUA will remain  in effect (meaning this test can be used) for the duration of the COVID-19 declaration under  Section 564(b)(1) of the Act, 21 U.S.C. section 360bbb-3(b)(1), unless the authorization is terminated or revoked sooner.     Influenza A by PCR NEGATIVE NEGATIVE Final   Influenza B by PCR NEGATIVE NEGATIVE Final    Comment: (NOTE) The Xpert Xpress SARS-CoV-2/FLU/RSV assay is intended as an aid in  the diagnosis of influenza from Nasopharyngeal swab specimens and  should not be used as a sole basis for treatment. Nasal washings and  aspirates are unacceptable for Xpert Xpress SARS-CoV-2/FLU/RSV  testing.  Fact Sheet for Patients: https://www.moore.com/  Fact Sheet for Healthcare Providers: https://www.young.biz/  This test is not yet approved or cleared by the [place] FDA and  has been authorized for detection and/or diagnosis of SARS-CoV-2 by  FDA under an Emergency Use Authorization (EUA). This EUA will remain  in effect (meaning this test can be used) for the duration of the  Covid-19 declaration under Section 564(b)(1) of the Act, 21  U.S.C. section 360bbb-3(b)(1), unless the authorization is  terminated or revoked. Performed at [facility], [address], [place], [place] [address]   Surgical pcr screen     Status: None   Collection Time: 01/02/20 12:58 PM   Specimen: Nasal Mucosa; Nasal Swab  Result Value Ref Range Status   MRSA, PCR NEGATIVE NEGATIVE Final   Staphylococcus aureus NEGATIVE NEGATIVE Final    Comment: (NOTE) The Xpert SA Assay (FDA approved for NASAL specimens in patients 6 years of age and older), is one component of a comprehensive surveillance program. It is not intended to diagnose infection nor to guide or monitor treatment. Performed at [facility], [address] [place], [place] [address]   Aerobic/Anaerobic Culture (surgical/deep wound)     Status: None (Preliminary result)   Collection Time: 01/02/20  4:29 PM   Specimen: Wound; Abscess  Result Value Ref Range Status    Specimen Description WOUND LEFT HAND  Final   Special Requests DORSAL  Final   Gram Stain   Final    RARE WBC PRESENT,BOTH PMN AND MONONUCLEAR NO ORGANISMS SEEN    Culture   Final    NO GROWTH < 24 HOURS Performed at [facility], [address] [place], [place] [address]    Report Status PENDING  Incomplete     Time coordinating discharge: 45 minutes  SIGNED:   Coralie KeensMauricio Daniel Fatemah Pourciau, MD  [facility] 01/03/2020, 11:31 AM

## 2020-01-03 NOTE — Care Management (Signed)
1229 01-03-20 Patient is aware to call [employer] today to find a primary care provider in network for [employer]. No further needs from Case Manager at this time. Gala Lewandowsky, RN,BSN Case Manager

## 2020-01-03 NOTE — Discharge Instructions (Signed)
Animal Bite, Adult Animal bites range from mild to serious. An animal bite can result in any of these injuries:  A scratch.  A deep, open cut.  A puncture of the skin.  A crush injury.  Tearing away of the skin or a body part.  A bone injury. A small bite from a house pet is usually less serious than a bite from a stray or wild animal, such as a raccoon, fox, skunk, or bat. That is because stray and wild animals have a higher risk of carrying a serious infection called rabies, which can be passed to humans through a bite. What increases the risk? You are more likely to be bitten by an animal if:  You are around unfamiliar pets.  You disturb an animal when it is eating, sleeping, or caring for its babies.  You are outdoors in a place where small, wild animals roam freely. What are the signs or symptoms? Common symptoms of an animal bite include:  Pain.  Bleeding.  Swelling.  Bruising. How is this diagnosed? This condition may be diagnosed based on a physical exam and medical history. Your health care provider will examine your wound and ask for details about the animal and how the bite happened. You may also have tests, such as:  Blood tests to check for infection.  X-rays to check for damage to bones or joints.  Taking a fluid sample from your wound and checking it for infection (culture test). How is this treated? Treatment varies depending on the type of animal, where the bite is on your body, and your medical history. Treatment may include:  Caring for the wound. This often includes cleaning the wound, rinsing out (flushing) the wound with saline solution, and applying a bandage (dressing). In some cases, the wound may be closed with stitches (sutures), staples, skin glue, or adhesive strips.  Antibiotic medicine to prevent or treat infection. This medicine may be prescribed in pill or ointment form. If the bite area becomes infected, the medicine may be given through  an IV.  A tetanus shot to prevent tetanus infection.  Rabies treatment to prevent rabies infection. This will be done if the animal could have rabies.  Surgery. This may be done if a bite gets infected or if there is damage that needs to be repaired. Follow these instructions at home: Wound care   Follow instructions from your health care provider about how to take care of your wound. Make sure you: ? Wash your hands with soap and water before you change your dressing. If soap and water are not available, use hand sanitizer. ? Change your dressing as told by your health care provider. ? Leave sutures, skin glue, or adhesive strips in place. These skin closures may need to stay in place for 2 weeks or longer. If adhesive strip edges start to loosen and curl up, you may trim the loose edges. Do not remove adhesive strips completely unless your health care provider tells you to do that.  Check your wound every day for signs of infection. Check for: ? More redness, swelling, or pain. ? More fluid or blood. ? Warmth. ? Pus or a bad smell. Medicines  Take or apply over-the-counter and prescription medicines only as told by your health care provider.  If you were prescribed an antibiotic, take or apply it as told by your health care provider. Do not stop using the antibiotic even if your condition improves. General instructions   Keep the injured   area raised (elevated) above the level of your heart while you are sitting or lying down, if this is possible.  If directed, put ice on the injured area. ? Put ice in a plastic bag. ? Place a towel between your skin and the bag. ? Leave the ice on for 20 minutes, 2-3 times per day.  Keep all follow-up visits as told by your health care provider. This is important. Contact a health care provider if:  You have more redness, swelling, or pain around your wound.  Your wound feels warm to the touch.  You have a fever or chills.  You have a  general feeling of sickness (malaise).  You feel nauseous or you vomit.  You have pain that does not get better. Get help right away if:  You have a red streak that leads away from your wound.  You have non-clear fluid or more blood coming from your wound.  There is pus or a bad smell coming from your wound.  You have trouble moving your injured area.  You have numbness or tingling that extends beyond the wound. Summary  Animal bites can range from mild to serious. An animal bite can cause a scratch on the skin, a deep open cut, a puncture of the skin, a crush injury, tearing away of the skin or a body part, or a bone injury.  Your health care provider will examine your wound and ask for details about the animal and how the bite happened.  You may also have tests such as a blood test, X-ray, or testing of a fluid sample from your wound (culture test).  Treatment may include wound care, antibiotic medicine, a tetanus shot, and rabies treatment if the animal could have rabies. This information is not intended to replace advice given to you by your health care provider. Make sure you discuss any questions you have with your health care provider. Document Revised: 03/09/2017 Document Reviewed: 09/22/2016 Elsevier Patient Education  2020 [employer]  

## 2020-01-07 LAB — AEROBIC/ANAEROBIC CULTURE W GRAM STAIN (SURGICAL/DEEP WOUND): Culture: NO GROWTH

## 2020-02-03 ENCOUNTER — Telehealth: Payer: Self-pay | Admitting: Adult Health

## 2020-02-03 NOTE — Telephone Encounter (Signed)
Erraneous entry.

## 2021-09-12 ENCOUNTER — Other Ambulatory Visit: Payer: Self-pay

## 2021-09-12 ENCOUNTER — Encounter (HOSPITAL_BASED_OUTPATIENT_CLINIC_OR_DEPARTMENT_OTHER): Payer: Self-pay | Admitting: Emergency Medicine

## 2021-09-12 ENCOUNTER — Emergency Department (HOSPITAL_BASED_OUTPATIENT_CLINIC_OR_DEPARTMENT_OTHER)
Admission: EM | Admit: 2021-09-12 | Discharge: 2021-09-12 | Disposition: A | Discharge status: Home / Self Care | Payer: 59 | Attending: Emergency Medicine | Admitting: Emergency Medicine

## 2021-09-12 ENCOUNTER — Emergency Department (HOSPITAL_BASED_OUTPATIENT_CLINIC_OR_DEPARTMENT_OTHER): Payer: 59

## 2021-09-12 DIAGNOSIS — R197 Diarrhea, unspecified: Secondary | ICD-10-CM | POA: Diagnosis not present

## 2021-09-12 DIAGNOSIS — R1084 Generalized abdominal pain: Secondary | ICD-10-CM | POA: Diagnosis not present

## 2021-09-12 DIAGNOSIS — R112 Nausea with vomiting, unspecified: Secondary | ICD-10-CM | POA: Diagnosis not present

## 2021-09-12 LAB — COMPREHENSIVE METABOLIC PANEL
ALT: 17 U/L (ref 0–44)
AST: 37 U/L (ref 15–41)
Albumin: 4.9 g/dL (ref 3.5–5.0)
Alkaline Phosphatase: 88 U/L (ref 38–126)
Anion gap: 14 (ref 5–15)
BUN: 19 mg/dL (ref 6–20)
CO2: 25 mmol/L (ref 22–32)
Calcium: 11.5 mg/dL — ABNORMAL HIGH (ref 8.9–10.3)
Chloride: 100 mmol/L (ref 98–111)
Creatinine, Ser: 1.11 mg/dL — ABNORMAL HIGH (ref 0.44–1.00)
GFR, Estimated: >60 mL/min (ref >60)
Glucose, Bld: 186 mg/dL — ABNORMAL HIGH (ref 70–99)
Potassium: 2.6 mmol/L — CL (ref 3.5–5.1)
Sodium: 139 mmol/L (ref 135–145)
Total Bilirubin: 0.8 mg/dL (ref 0.3–1.2)
Total Protein: 8.2 g/dL — ABNORMAL HIGH (ref 6.5–8.1)

## 2021-09-12 LAB — LIPASE, BLOOD: Lipase: 25 U/L (ref 11–51)

## 2021-09-12 LAB — CBC WITH DIFFERENTIAL/PLATELET
Abs Immature Granulocytes: 0.21 10*3/uL — ABNORMAL HIGH (ref 0.00–0.07)
Basophils Absolute: 0.1 10*3/uL (ref 0.0–0.1)
Basophils Relative: 0 %
Eosinophils Absolute: 0 10*3/uL (ref 0.0–0.5)
Eosinophils Relative: 0 %
HCT: 46.3 % — ABNORMAL HIGH (ref 36.0–46.0)
Hemoglobin: 16.8 g/dL — ABNORMAL HIGH (ref 12.0–15.0)
Immature Granulocytes: 1 %
Lymphocytes Relative: 6 %
Lymphs Abs: 1.5 10*3/uL (ref 0.7–4.0)
MCH: 30.9 pg (ref 26.0–34.0)
MCHC: 36.3 g/dL — ABNORMAL HIGH (ref 30.0–36.0)
MCV: 85.3 fL (ref 80.0–100.0)
Monocytes Absolute: 1.8 10*3/uL — ABNORMAL HIGH (ref 0.1–1.0)
Monocytes Relative: 7 %
Neutro Abs: 23.3 10*3/uL — ABNORMAL HIGH (ref 1.7–7.7)
Neutrophils Relative %: 86 %
Platelets: 539 10*3/uL — ABNORMAL HIGH (ref 150–400)
RBC: 5.43 MIL/uL — ABNORMAL HIGH (ref 3.87–5.11)
RDW: 13 % (ref 11.5–15.5)
Smear Review: NORMAL
WBC: 26.9 10*3/uL — ABNORMAL HIGH (ref 4.0–10.5)
nRBC: 0 % (ref 0.0–0.2)

## 2021-09-12 MED ORDER — HALOPERIDOL LACTATE 5 MG/ML IJ SOLN: 5.0000 mg | Freq: Once | INTRAMUSCULAR | Status: DC

## 2021-09-12 MED ORDER — SUCRALFATE 1 G PO TABS: 1.0000 g | ORAL_TABLET | Freq: Three times a day (TID) | ORAL | 0 refills | Status: AC

## 2021-09-12 MED ORDER — PROMETHAZINE HCL 25 MG PO TABS: 25.0000 mg | ORAL_TABLET | Freq: Four times a day (QID) | ORAL | 0 refills | Status: DC | PRN

## 2021-09-12 MED ORDER — SUCRALFATE 1 GM/10ML PO SUSP
1.0000 g | Freq: Once | ORAL | Status: AC
  Administered 2021-09-12: 1 g via ORAL
  Filled 2021-09-12: qty 10

## 2021-09-12 MED ORDER — HYDROMORPHONE HCL 1 MG/ML IJ SOLN
1.0000 mg | Freq: Once | INTRAMUSCULAR | Status: AC
  Administered 2021-09-12: 1 mg via INTRAVENOUS

## 2021-09-12 MED ORDER — SODIUM CHLORIDE 0.9 % IV BOLUS: 1000.0000 mL | Freq: Once | INTRAVENOUS | Status: DC

## 2021-09-12 MED ORDER — ONDANSETRON HCL 4 MG/2ML IJ SOLN
4.0000 mg | Freq: Once | INTRAMUSCULAR | Status: AC
Start: 2021-09-12 — End: 2021-09-12
  Administered 2021-09-12: 4 mg via INTRAVENOUS
  Filled 2021-09-12: qty 2

## 2021-09-12 MED ORDER — SODIUM CHLORIDE 0.9 % IV BOLUS
1000.0000 mL | Freq: Once | INTRAVENOUS | Status: AC
  Administered 2021-09-12: 1000 mL via INTRAVENOUS

## 2021-09-12 MED ORDER — HYDROMORPHONE HCL 1 MG/ML IJ SOLN
1.0000 mg | Freq: Once | INTRAMUSCULAR | Status: DC
  Filled 2021-09-12: qty 1

## 2021-09-12 MED ORDER — HALOPERIDOL LACTATE 5 MG/ML IJ SOLN
5.0000 mg | Freq: Once | INTRAMUSCULAR | Status: AC
  Administered 2021-09-12: 5 mg via INTRAVENOUS
  Filled 2021-09-12: qty 1

## 2021-09-12 MED ORDER — POTASSIUM CHLORIDE 10 MEQ/100ML IV SOLN
10.0000 meq | Freq: Once | INTRAVENOUS | Status: AC
  Administered 2021-09-12: 10 meq via INTRAVENOUS
  Filled 2021-09-12: qty 100

## 2021-09-12 NOTE — ED Provider Notes (Signed)
[facility] EMERGENCY DEPARTMENT Provider Note   CSN: [id number] Arrival date & time: 09/12/21  2005     History  Chief Complaint  Patient presents with   Abdominal Pain    Anna Huang is a 46 y.o. female.  Patient arrives with abdominal pain, nausea, vomiting, diarrhea.  History of opioid abuse, marijuana use, colitis, kidney stones.  Pain diffuse throughout the abdomen.  Nothing makes it worse or better.  Denies any fevers or chills.  No sick contacts.  Not sure if she has had any suspicious food intake.  No chest pains, shortness of breath, weakness, numbness, vaginal bleeding.  She had a history of hysterectomy.  Does admit to using some edibles/THC.  The history is provided by the patient.       Home Medications Prior to Admission medications   Medication Sig Start Date End Date Taking? Authorizing Provider  promethazine (PHENERGAN) 25 MG tablet Take 1 tablet (25 mg total) by mouth every 6 (six) hours as needed for nausea or vomiting. 09/12/21  Yes Skylyn Slezak, DO  acetaminophen (TYLENOL) 325 MG tablet Take 2 tablets (650 mg total) by mouth every 6 (six) hours as needed for mild pain (or Fever >/= 101). 01/03/20   Arrien, York Ram, MD  ibuprofen (ADVIL) 400 MG tablet Take 1 tablet (400 mg total) by mouth every 8 (eight) hours as needed for moderate pain. 01/03/20   Arrien, York Ram, MD  oxyCODONE (OXY IR/ROXICODONE) 5 MG immediate release tablet Take 1 tablet (5 mg total) by mouth every 6 (six) hours as needed for severe pain or breakthrough pain. 01/03/20   Arrien, York Ram, MD      Allergies    Tramadol    Review of Systems   Review of Systems  Physical Exam Updated Vital Signs  ED Triage Vitals  Enc Vitals Group     BP 09/12/21 2006 (!) 135/114     Pulse Rate 09/12/21 2006 (!) 101     Resp 09/12/21 2006 18     Temp 09/12/21 2006 97.6 F (36.4 C)     Temp Source 09/12/21 2006 Oral     SpO2 09/12/21 2006 96 %     Weight 09/12/21  2014 165 lb (74.8 kg)     Height 09/12/21 2014 5\' 5"  (1.651 m)     Head Circumference --      Peak Flow --      Pain Score 09/12/21 2014 10     Pain Loc --      Pain Edu? --      Excl. in GC? --     Physical Exam Vitals and nursing note reviewed.  Constitutional:      General: She is not in acute distress.    Appearance: She is well-developed. She is ill-appearing.  HENT:     Head: Normocephalic and atraumatic.     Mouth/Throat:     Mouth: Mucous membranes are moist.  Eyes:     Extraocular Movements: Extraocular movements intact.     Conjunctiva/sclera: Conjunctivae normal.     Pupils: Pupils are equal, round, and reactive to light.  Cardiovascular:     Rate and Rhythm: Normal rate and regular rhythm.     Heart sounds: Normal heart sounds. No murmur heard. Pulmonary:     Effort: Pulmonary effort is normal. No respiratory distress.     Breath sounds: Normal breath sounds.  Abdominal:     Palpations: Abdomen is soft.     Tenderness:  There is generalized abdominal tenderness.  Musculoskeletal:        General: No swelling.     Cervical back: Neck supple.  Skin:    General: Skin is warm and dry.     Capillary Refill: Capillary refill takes less than 2 seconds.  Neurological:     Mental Status: She is alert.  Psychiatric:        Mood and Affect: Mood normal.     ED Results / Procedures / Treatments   Labs (all labs ordered are listed, but only abnormal results are displayed) Labs Reviewed  CBC WITH DIFFERENTIAL/PLATELET - Abnormal; Notable for the following components:      Result Value   WBC 26.9 (*)    RBC 5.43 (*)    Hemoglobin 16.8 (*)    HCT 46.3 (*)    MCHC 36.3 (*)    Platelets 539 (*)    Neutro Abs 23.3 (*)    Monocytes Absolute 1.8 (*)    Abs Immature Granulocytes 0.21 (*)    All other components within normal limits  COMPREHENSIVE METABOLIC PANEL - Abnormal; Notable for the following components:   Potassium 2.6 (*)    Glucose, Bld 186 (*)     Creatinine, Ser 1.11 (*)    Calcium 11.5 (*)    Total Protein 8.2 (*)    All other components within normal limits  LIPASE, BLOOD    EKG EKG Interpretation  Date/Time:  Sunday September 12 2021 20:22:35 EDT Ventricular Rate:  81 PR Interval:  124 QRS Duration: 67 QT Interval:  435 QTC Calculation: 505 R Axis:   65 Text Interpretation: Sinus rhythm Ventricular premature complex Biatrial enlargement Low voltage, precordial leads Confirmed by Virgina Norfolk ([id number]) on 09/12/2021 8:45:47 PM  Radiology CT Renal Stone Study  Result Date: 09/12/2021 CLINICAL DATA:  Flank pain, kidney stone suspected EXAM: CT ABDOMEN AND PELVIS WITHOUT CONTRAST TECHNIQUE: Multidetector CT imaging of the abdomen and pelvis was performed following the standard protocol without IV contrast. RADIATION DOSE REDUCTION: This exam was performed according to the departmental dose-optimization program which includes automated exposure control, adjustment of the mA and/or kV according to patient size and/or use of iterative reconstruction technique. COMPARISON:  06/02/2018 FINDINGS: Lower chest: No acute abnormality. Hepatobiliary: Subcentimeter low-density lesion within the right hepatic lobe is unchanged and remains too small to characterize. Focal fatty infiltration along the falciform ligament. Unenhanced liver appears otherwise within normal limits. Prior cholecystectomy. No biliary dilatation. Pancreas: Unremarkable. No pancreatic ductal dilatation or surrounding inflammatory changes. Spleen: Normal in size without focal abnormality. Adrenals/Urinary Tract: Unremarkable adrenal glands. Tiny 1-2 mm punctate nonobstructing stone within the upper pole of the left kidney. Kidneys appear otherwise unremarkable. No right-sided renal calculi. No hydronephrosis. Bilateral ureters appear unremarkable. Urinary bladder is decompressed, limiting its evaluation. Stomach/Bowel: Stomach is moderately distended with fluid but appears otherwise  unremarkable. No dilated loops of small bowel. Unremarkable appendix in the right lower quadrant. Scattered colonic diverticulosis. Small volume liquid stool within the rectum. Vascular/Lymphatic: Scattered aortoiliac atherosclerotic calcifications without aneurysm. No abdominopelvic lymphadenopathy. Reproductive: Status post hysterectomy. No adnexal masses. Other: No free fluid. No abdominopelvic fluid collection. No pneumoperitoneum. Unchanged fat containing supra umbilical hernia. Musculoskeletal: No acute or significant osseous findings. IMPRESSION: 1. Tiny nonobstructing stone within the upper pole of the left kidney. No hydronephrosis. 2. Small volume liquid stool within the rectum, which can be seen in the setting of a diarrheal illness. 3. Colonic diverticulosis without evidence of acute diverticulitis. 4. Unchanged fat containing  supraumbilical hernia. 5. Aortic atherosclerosis (ICD10-I70.0). Electronically Signed   By: Duanne Guess D.O.   On: 09/12/2021 21:00    Procedures Procedures    Medications Ordered in ED Medications  potassium chloride 10 mEq in 100 mL IVPB (10 mEq Intravenous New Bag/Given 09/12/21 2117)  sodium chloride 0.9 % bolus 1,000 mL (1,000 mLs Intravenous New Bag/Given 09/12/21 2020)  ondansetron (ZOFRAN) injection 4 mg (4 mg Intravenous Given 09/12/21 2019)  haloperidol lactate (HALDOL) injection 5 mg (5 mg Intravenous Given 09/12/21 2023)  HYDROmorphone (DILAUDID) injection 1 mg (1 mg Intravenous Given 09/12/21 2027)    ED Course/ Medical Decision Making/ A&P                           Medical Decision Making Amount and/or Complexity of Data Reviewed Labs: ordered. Radiology: ordered.  Risk Prescription drug management.   Anna Huang is here with abdominal pain.  Unremarkable vitals.  Differential diagnosis is hyperemesis from marijuana use, opioid withdrawal, viral gastroenteritis, kidney stones, colitis, less likely appendicitis and bowel obstruction.  Will  give IV fluids, IV Zofran, Haldol.  We will get CBC, CMP, lipase, urinalysis, CT scan abdomen pelvis.  Patient feeling much better after antiemetics.  She or my review interpretation labs is unremarkable work-up.  Potassium 2.6 this was repleted.  White count is 26 but I suspect that this is from stress reaction from her multiple bouts of emesis.  She has no fever.  CT scans unremarkable.  No acute findings.  No significant AKI.  Overall suspect viral GI process versus hyperemesis from marijuana.  Will prescribe Phenergan.  Discharged in good condition.  This chart was dictated using voice recognition software.  Despite best efforts to proofread,  errors can occur which can change the documentation meaning.         Final Clinical Impression(s) / ED Diagnoses Final diagnoses:  Nausea vomiting and diarrhea    Rx / DC Orders ED Discharge Orders          Ordered    promethazine (PHENERGAN) 25 MG tablet  Every 6 hours PRN        09/12/21 2136              Virgina Norfolk, DO 09/12/21 2136

## 2021-09-12 NOTE — ED Notes (Signed)
Notified Dr. Lockie Mola of critical K+-2.6 received by lab. Physician acknowledged result. aRn also notified.

## 2021-09-12 NOTE — ED Triage Notes (Signed)
N/v/d abd pain around umbilicus. Pt appears to be in distress, states she can't stand up due to pain, actively vomiting in triage, and uncontrollable bowels.

## 2021-09-17 ENCOUNTER — Encounter (HOSPITAL_BASED_OUTPATIENT_CLINIC_OR_DEPARTMENT_OTHER): Payer: Self-pay

## 2021-09-17 ENCOUNTER — Emergency Department (HOSPITAL_BASED_OUTPATIENT_CLINIC_OR_DEPARTMENT_OTHER): Payer: 59

## 2021-09-17 ENCOUNTER — Emergency Department (HOSPITAL_BASED_OUTPATIENT_CLINIC_OR_DEPARTMENT_OTHER)
Admission: EM | Admit: 2021-09-17 | Discharge: 2021-09-17 | Disposition: A | Discharge status: Home / Self Care | Payer: 59 | Attending: Emergency Medicine | Admitting: Emergency Medicine

## 2021-09-17 DIAGNOSIS — R112 Nausea with vomiting, unspecified: Secondary | ICD-10-CM | POA: Diagnosis present

## 2021-09-17 DIAGNOSIS — R197 Diarrhea, unspecified: Secondary | ICD-10-CM | POA: Diagnosis not present

## 2021-09-17 DIAGNOSIS — L539 Erythematous condition, unspecified: Secondary | ICD-10-CM | POA: Insufficient documentation

## 2021-09-17 DIAGNOSIS — R109 Unspecified abdominal pain: Secondary | ICD-10-CM | POA: Diagnosis not present

## 2021-09-17 LAB — COMPREHENSIVE METABOLIC PANEL
ALT: 17 U/L (ref 0–44)
AST: 17 U/L (ref 15–41)
Albumin: 4.4 g/dL (ref 3.5–5.0)
Alkaline Phosphatase: 73 U/L (ref 38–126)
Anion gap: 6 (ref 5–15)
BUN: 13 mg/dL (ref 6–20)
CO2: 28 mmol/L (ref 22–32)
Calcium: 10.1 mg/dL (ref 8.9–10.3)
Chloride: 105 mmol/L (ref 98–111)
Creatinine, Ser: 0.86 mg/dL (ref 0.44–1.00)
GFR, Estimated: >60 mL/min (ref >60)
Glucose, Bld: 106 mg/dL — ABNORMAL HIGH (ref 70–99)
Potassium: 3.3 mmol/L — ABNORMAL LOW (ref 3.5–5.1)
Sodium: 139 mmol/L (ref 135–145)
Total Bilirubin: 0.8 mg/dL (ref 0.3–1.2)
Total Protein: 7.4 g/dL (ref 6.5–8.1)

## 2021-09-17 LAB — URINALYSIS, MICROSCOPIC (REFLEX): RBC / HPF: NONE SEEN RBC/hpf (ref 0–5)

## 2021-09-17 LAB — URINALYSIS, ROUTINE W REFLEX MICROSCOPIC
Bilirubin Urine: NEGATIVE
Glucose, UA: NEGATIVE mg/dL
Hgb urine dipstick: NEGATIVE
Ketones, ur: 15 mg/dL — AB
Leukocytes,Ua: NEGATIVE
Nitrite: NEGATIVE
Protein, ur: 30 mg/dL — AB
Specific Gravity, Urine: 1.025 (ref 1.005–1.030)
pH: 7 (ref 5.0–8.0)

## 2021-09-17 LAB — CBC WITH DIFFERENTIAL/PLATELET
Abs Immature Granulocytes: 0.02 10*3/uL (ref 0.00–0.07)
Basophils Absolute: 0.1 10*3/uL (ref 0.0–0.1)
Basophils Relative: 1 %
Eosinophils Absolute: 0 10*3/uL (ref 0.0–0.5)
Eosinophils Relative: 0 %
HCT: 44.9 % (ref 36.0–46.0)
Hemoglobin: 15.8 g/dL — ABNORMAL HIGH (ref 12.0–15.0)
Immature Granulocytes: 0 %
Lymphocytes Relative: 27 %
Lymphs Abs: 3.3 10*3/uL (ref 0.7–4.0)
MCH: 30.9 pg (ref 26.0–34.0)
MCHC: 35.2 g/dL (ref 30.0–36.0)
MCV: 87.9 fL (ref 80.0–100.0)
Monocytes Absolute: 0.8 10*3/uL (ref 0.1–1.0)
Monocytes Relative: 7 %
Neutro Abs: 7.9 10*3/uL — ABNORMAL HIGH (ref 1.7–7.7)
Neutrophils Relative %: 65 %
Platelets: 386 10*3/uL (ref 150–400)
RBC: 5.11 MIL/uL (ref 3.87–5.11)
RDW: 12.7 % (ref 11.5–15.5)
WBC: 12.1 10*3/uL — ABNORMAL HIGH (ref 4.0–10.5)
nRBC: 0 % (ref 0.0–0.2)

## 2021-09-17 LAB — LIPASE, BLOOD: Lipase: 46 U/L (ref 11–51)

## 2021-09-17 LAB — MAGNESIUM: Magnesium: 1.9 mg/dL (ref 1.7–2.4)

## 2021-09-17 MED ORDER — MORPHINE SULFATE (PF) 2 MG/ML IV SOLN
2.0000 mg | Freq: Once | INTRAVENOUS | Status: AC
  Administered 2021-09-17: 2 mg via INTRAVENOUS
  Filled 2021-09-17: qty 1

## 2021-09-17 MED ORDER — IOHEXOL 300 MG/ML  SOLN
100.0000 mL | Freq: Once | INTRAMUSCULAR | Status: AC | PRN
  Administered 2021-09-17: 100 mL via INTRAVENOUS

## 2021-09-17 MED ORDER — PROMETHAZINE HCL 25 MG PO TABS: 25.0000 mg | ORAL_TABLET | Freq: Four times a day (QID) | ORAL | 0 refills | Status: AC | PRN

## 2021-09-17 MED ORDER — SODIUM CHLORIDE 0.9 % IV BOLUS
1000.0000 mL | Freq: Once | INTRAVENOUS | Status: AC
  Administered 2021-09-17: 1000 mL via INTRAVENOUS

## 2021-09-17 MED ORDER — ONDANSETRON HCL 4 MG/2ML IJ SOLN
4.0000 mg | Freq: Once | INTRAMUSCULAR | Status: AC
  Administered 2021-09-17: 4 mg via INTRAVENOUS
  Filled 2021-09-17: qty 2

## 2021-09-17 NOTE — ED Notes (Signed)
Patient transported to CT 

## 2022-03-04 ENCOUNTER — Emergency Department (HOSPITAL_BASED_OUTPATIENT_CLINIC_OR_DEPARTMENT_OTHER)
Admission: EM | Admit: 2022-03-04 | Discharge: 2022-03-04 | Disposition: A | Discharge status: Home / Self Care | Payer: Self-pay | Attending: Emergency Medicine | Admitting: Emergency Medicine

## 2022-03-04 ENCOUNTER — Other Ambulatory Visit: Payer: Self-pay

## 2022-03-04 ENCOUNTER — Emergency Department (HOSPITAL_BASED_OUTPATIENT_CLINIC_OR_DEPARTMENT_OTHER): Payer: Self-pay

## 2022-03-04 ENCOUNTER — Encounter (HOSPITAL_BASED_OUTPATIENT_CLINIC_OR_DEPARTMENT_OTHER): Payer: Self-pay | Admitting: Emergency Medicine

## 2022-03-04 DIAGNOSIS — S9031XA Contusion of right foot, initial encounter: Secondary | ICD-10-CM | POA: Insufficient documentation

## 2022-03-04 DIAGNOSIS — S20219A Contusion of unspecified front wall of thorax, initial encounter: Secondary | ICD-10-CM | POA: Diagnosis not present

## 2022-03-04 DIAGNOSIS — M79671 Pain in right foot: Secondary | ICD-10-CM | POA: Diagnosis present

## 2022-03-04 DIAGNOSIS — S61551A Open bite of right wrist, initial encounter: Secondary | ICD-10-CM | POA: Diagnosis not present

## 2022-03-04 DIAGNOSIS — W540XXA Bitten by dog, initial encounter: Secondary | ICD-10-CM | POA: Insufficient documentation

## 2022-03-04 DIAGNOSIS — Y92410 Unspecified street and highway as the place of occurrence of the external cause: Secondary | ICD-10-CM | POA: Insufficient documentation

## 2022-03-04 DIAGNOSIS — Y93I9 Activity, other involving external motion: Secondary | ICD-10-CM | POA: Insufficient documentation

## 2022-03-04 DIAGNOSIS — S300XXA Contusion of lower back and pelvis, initial encounter: Secondary | ICD-10-CM | POA: Diagnosis not present

## 2022-03-04 MED ORDER — AMOXICILLIN-POT CLAVULANATE 500-125 MG PO TABS: 1.0000 | ORAL_TABLET | Freq: Three times a day (TID) | ORAL | 0 refills | Status: AC

## 2022-03-04 MED ORDER — AMOXICILLIN-POT CLAVULANATE 875-125 MG PO TABS
1.0000 | ORAL_TABLET | Freq: Once | ORAL | Status: AC
  Administered 2022-03-04: 1 via ORAL
  Filled 2022-03-04: qty 1
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
## 2022-03-04 NOTE — Discharge Instructions (Signed)
Begin taking Augmentin as prescribed.  Take ibuprofen 600 mg every 6 hours as needed for pain.  Follow-up with your primary doctor if symptoms or not improving in the next few days.

## 2022-03-04 NOTE — ED Notes (Signed)
Patient transported to X-ray 
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
## 2023-03-05 ENCOUNTER — Other Ambulatory Visit: Payer: Self-pay

## 2023-03-05 ENCOUNTER — Emergency Department (HOSPITAL_BASED_OUTPATIENT_CLINIC_OR_DEPARTMENT_OTHER): Payer: BC Managed Care – PPO

## 2023-03-05 ENCOUNTER — Emergency Department (HOSPITAL_BASED_OUTPATIENT_CLINIC_OR_DEPARTMENT_OTHER)
Admission: EM | Admit: 2023-03-05 | Discharge: 2023-03-05 | Disposition: A | Discharge status: Home / Self Care | Payer: BC Managed Care – PPO | Attending: Emergency Medicine | Admitting: Emergency Medicine

## 2023-03-05 ENCOUNTER — Encounter (HOSPITAL_BASED_OUTPATIENT_CLINIC_OR_DEPARTMENT_OTHER): Payer: Self-pay | Admitting: Emergency Medicine

## 2023-03-05 DIAGNOSIS — M25512 Pain in left shoulder: Secondary | ICD-10-CM | POA: Diagnosis present

## 2023-03-05 DIAGNOSIS — W19XXXA Unspecified fall, initial encounter: Secondary | ICD-10-CM | POA: Insufficient documentation

## 2023-03-05 DIAGNOSIS — G8911 Acute pain due to trauma: Secondary | ICD-10-CM | POA: Insufficient documentation

## 2023-03-05 MED ORDER — LIDOCAINE 5 % EX PTCH: 1.0000 | MEDICATED_PATCH | CUTANEOUS | 0 refills | Status: AC

## 2023-03-05 MED ORDER — METHOCARBAMOL 500 MG PO TABS: 500.0000 mg | ORAL_TABLET | Freq: Two times a day (BID) | ORAL | 0 refills | Status: AC

## 2023-03-05 MED ORDER — OXYCODONE-ACETAMINOPHEN 5-325 MG PO TABS: 1.0000 | ORAL_TABLET | Freq: Three times a day (TID) | ORAL | 0 refills | Status: AC | PRN

## 2023-03-05 MED ORDER — HYDROCODONE-ACETAMINOPHEN 5-325 MG PO TABS
1.0000 | ORAL_TABLET | Freq: Once | ORAL | Status: AC
  Administered 2023-03-05: 1 via ORAL
  Filled 2023-03-05: qty 1

## 2023-03-05 MED ORDER — NAPROXEN 500 MG PO TABS: 500.0000 mg | ORAL_TABLET | Freq: Two times a day (BID) | ORAL | 0 refills | Status: AC

## 2023-03-05 NOTE — ED Notes (Signed)
Pt alert and oriented X 4 at the time of discharge. RR even and unlabored. No acute distress noted. Pt verbalized understanding of discharge instructions as discussed. Pt ambulatory to lobby at time of discharge.

## 2023-03-05 NOTE — ED Triage Notes (Signed)
Pt c/o LT shoulder pain s/p fall last night

## 2023-03-05 NOTE — ED Provider Notes (Signed)
[place] [facility] Provider Note   CSN: [id number] Arrival date & time: 03/05/23  1409    History  Chief Complaint  Patient presents with   Shoulder Pain    Anna Huang is a 47 y.o. female here for evaluation mechanical fall yesterday.  She has had left shoulder pain since then.  Denies any head, LOC or anticoagulation.  Ambulatory PTA.  Has had difficulty raising her left shoulder over her head since the summer when she fell.  No numbness, weakness.  No neck pain.  No chest pain or shortness of breath.  Pain goes underneath her left axilla.  No meds PTA.  Has not followed with Ortho.  HPI     Home Medications Prior to Admission medications   Medication Sig Start Date End Date Taking? Authorizing Provider  lidocaine (LIDODERM) 5 % Place 1 patch onto the skin daily. Remove & Discard patch within 12 hours or as directed by MD 03/05/23  Yes Kentrail Shew A, PA-C  methocarbamol (ROBAXIN) 500 MG tablet Take 1 tablet (500 mg total) by mouth 2 (two) times daily. 03/05/23  Yes Tagg Eustice A, PA-C  naproxen (NAPROSYN) 500 MG tablet Take 1 tablet (500 mg total) by mouth 2 (two) times daily. 03/05/23  Yes Eather Chaires A, PA-C  oxyCODONE-acetaminophen (PERCOCET/ROXICET) 5-325 MG tablet Take 1 tablet by mouth every 8 (eight) hours as needed for severe pain (pain score 7-10). 03/05/23  Yes Vernal Hritz A, PA-C  acetaminophen (TYLENOL) 325 MG tablet Take 2 tablets (650 mg total) by mouth every 6 (six) hours as needed for mild pain (or Fever >/= 101). 01/03/20   Arrien, York Ram, MD  amoxicillin-clavulanate (AUGMENTIN) 500-125 MG tablet Take 1 tablet by mouth every 8 (eight) hours. 03/04/22   Geoffery Lyons, MD  ibuprofen (ADVIL) 400 MG tablet Take 1 tablet (400 mg total) by mouth every 8 (eight) hours as needed for moderate pain. 01/03/20   Arrien, York Ram, MD  oxyCODONE (OXY IR/ROXICODONE) 5 MG immediate release tablet Take 1 tablet (5  mg total) by mouth every 6 (six) hours as needed for severe pain or breakthrough pain. 01/03/20   Arrien, York Ram, MD  promethazine (PHENERGAN) 25 MG tablet Take 1 tablet (25 mg total) by mouth every 6 (six) hours as needed for nausea or vomiting. 09/17/21   Melene Plan, DO  sucralfate (CARAFATE) 1 g tablet Take 1 tablet (1 g total) by mouth 4 (four) times daily -  with meals and at bedtime for 14 days. 09/12/21 09/26/21  Virgina Norfolk, DO      Allergies    Tramadol    Review of Systems   Review of Systems  Constitutional: Negative.   HENT: Negative.    Respiratory: Negative.    Cardiovascular: Negative.   Gastrointestinal: Negative.   Genitourinary: Negative.   Musculoskeletal:        Left shoulder pain  Skin: Negative.   Neurological: Negative.   All other systems reviewed and are negative.   Physical Exam Updated Vital Signs BP 117/63   Pulse 82   Temp 98.1 F (36.7 C) (Oral)   Resp 16   Ht 5\' 5"  (1.651 m)   Wt 74.4 kg   LMP 12/11/2016   SpO2 97%   BMI 27.29 kg/m  Physical Exam Vitals and nursing note reviewed.  Constitutional:      General: She is not in acute distress.    Appearance: She is well-developed. She is not ill-appearing,  toxic-appearing or diaphoretic.  HENT:     Head: Normocephalic and atraumatic.  Eyes:     Pupils: Pupils are equal, round, and reactive to light.  Cardiovascular:     Rate and Rhythm: Normal rate and regular rhythm.     Pulses: Normal pulses.          Radial pulses are 2+ on the right side and 2+ on the left side.     Heart sounds: Normal heart sounds.  Pulmonary:     Effort: Pulmonary effort is normal. No respiratory distress.     Breath sounds: Normal breath sounds.  Abdominal:     General: Bowel sounds are normal. There is no distension.     Palpations: Abdomen is soft.     Tenderness: There is no abdominal tenderness.  Musculoskeletal:        General: Normal range of motion.     Cervical back: Normal range of motion  and neck supple.     Comments: No midline C/T/L tenderness.  Tenderness diffusely mid and proximal left humerus.  Able to flex and extend at elbow.  Nontender forearm, hand.  Abrasion distal humerus, scabbed.  Difficulty with range of motion overhead above 40 degrees with passive or active range of motion Nontender bilateral lower extremities, full range of motion  Skin:    General: Skin is warm and dry.     Capillary Refill: Capillary refill takes less than 2 seconds.     Findings: Bruising present.  Neurological:     General: No focal deficit present.     Mental Status: She is alert and oriented to person, place, and time.     Cranial Nerves: Cranial nerves 2-12 are intact.     Sensory: Sensation is intact.     Gait: Gait is intact.     Comments: Tach sensation, ambulatory     ED Results / Procedures / Treatments   Labs (all labs ordered are listed, but only abnormal results are displayed) Labs Reviewed - No data to display  EKG None  Radiology DG Humerus Left  Result Date: 03/05/2023 CLINICAL DATA:  Left upper arm pain since falling yesterday. EXAM: LEFT HUMERUS - 2+ VIEW COMPARISON:  Radiographs 12/09/2008. FINDINGS: The mineralization and alignment are normal. There is no evidence of acute fracture or dislocation. The joint spaces appear maintained. The elbow is not imaged in the lateral projection, limiting assessment for an elbow effusion. The soft tissues appear unremarkable. IMPRESSION: No evidence of acute fracture or dislocation. The elbow is not imaged in the lateral projection, limiting assessment for an elbow effusion. Electronically Signed   By: Carey Bullocks M.D.   On: 03/05/2023 15:17   DG Ribs Unilateral W/Chest Left  Result Date: 03/05/2023 CLINICAL DATA:  Larey Seat yesterday, left rib and upper arm pain EXAM: LEFT RIBS AND CHEST - 3+ VIEW COMPARISON:  03/04/2022 FINDINGS: Frontal view of the chest as well as frontal and oblique views of the left thoracic cage are  obtained. Cardiac silhouette is unremarkable. No airspace disease, effusion, or pneumothorax. No acute displaced fractures. IMPRESSION: 1. No acute intrathoracic process. Electronically Signed   By: Sharlet Salina M.D.   On: 03/05/2023 15:17    Procedures .Ortho Injury Treatment  Date/Time: 03/05/2023 3:42 PM  Performed by: Linwood Dibbles, PA-C Authorized by: Linwood Dibbles, PA-C   Consent:    Consent obtained:  Verbal   Consent given by:  Patient   Risks discussed:  Fracture, nerve damage, restricted joint movement, vascular  damage, stiffness, recurrent dislocation and irreducible dislocation   Alternatives discussed:  No treatment, alternative treatment, immobilization, referral and delayed treatmentInjury location: shoulder Location details: left shoulder Injury type: soft tissue Pre-procedure neurovascular assessment: neurovascularly intact Pre-procedure distal perfusion: normal Pre-procedure neurological function: normal Pre-procedure range of motion: normal  Anesthesia: Local anesthesia used: no  Patient sedated: NoImmobilization: sling Splint Applied by: ED Nurse Post-procedure neurovascular assessment: post-procedure neurovascularly intact Post-procedure distal perfusion: normal Post-procedure neurological function: normal Post-procedure range of motion: normal       Medications Ordered in ED Medications  HYDROcodone-acetaminophen (NORCO/VICODIN) 5-325 MG per tablet 1 tablet (1 tablet Oral Given 03/05/23 1450)   ED Course/ Medical Decision Making/ A&P   47 year old here for evaluation mechanical fall which occurred yesterday.  Left shoulder pain since then.  She actually states she has been dealing with shoulder pain difficulty with range of motion after a fall this past summer.  She denies any new head injury.  No midline C/T/L tenderness.  She is neurovascularly intact.  She has small abrasion which is scabbed over to her distal humerus.  She has some diffuse  pain to her distal and proximal humerus.  Difficulty with range of motion overhead.  She denies any history of rotator cuff injury.  Plan on imaging and reassess  Imaging personally viewed and interpreted:  No acute abnormality  Discussed results with patient.  High suspicion for rotator cuff injury.  Her pain is controlled here.  Her tetanus is up-to-date.  She was placed in sling.  Will have her follow-up with orthopedics/sports medicine.  Low suspicion for acute ACS, PE, dissection, VTE, septic joint, fracture, dislocation as cause of her left shoulder pain  The patient has been appropriately medically screened and/or stabilized in the ED. I have low suspicion for any other emergent medical condition which would require further screening, evaluation or treatment in the ED or require inpatient management.  Patient is hemodynamically stable and in no acute distress.  Patient able to ambulate in department prior to ED.  Evaluation does not show acute pathology that would require ongoing or additional emergent interventions while in the emergency department or further inpatient treatment.  I have discussed the diagnosis with the patient and answered all questions.  Pain is been managed while in the emergency department and patient has no further complaints prior to discharge.  Patient is comfortable with plan discussed in room and is stable for discharge at this time.  I have discussed strict return precautions for returning to the emergency department.  Patient was encouraged to follow-up with PCP/specialist refer to at discharge.                                Medical Decision Making Amount and/or Complexity of Data Reviewed External Data Reviewed: labs, radiology and notes. Radiology: ordered and independent interpretation performed. Decision-making details documented in ED Course.  Risk OTC drugs. Prescription drug management. Decision regarding hospitalization. Diagnosis or treatment  significantly limited by social determinants of health.          Final Clinical Impression(s) / ED Diagnoses Final diagnoses:  Fall, initial encounter  Acute pain of left shoulder    Rx / DC Orders ED Discharge Orders          Ordered    methocarbamol (ROBAXIN) 500 MG tablet  2 times daily        03/05/23 1531    naproxen (NAPROSYN) 500 MG  tablet  2 times daily        03/05/23 1531    lidocaine (LIDODERM) 5 %  Every 24 hours        03/05/23 1531    oxyCODONE-acetaminophen (PERCOCET/ROXICET) 5-325 MG tablet  Every 8 hours PRN        03/05/23 1540              Kareli Hossain A, PA-C 03/05/23 1543    Vanetta Mulders, MD 03/07/23 1721

## 2023-03-05 NOTE — Discharge Instructions (Addendum)
It was a pleasure taking care of you here in the emergency department today  Your imaging today did not show evidence of broken bones however due to decreased range of motion of your shoulder could possibly be they have a torn rotator cuff  Make sure to follow-up with orthopedist  Return for new or worsening symptoms

## 2023-07-08 ENCOUNTER — Encounter (HOSPITAL_BASED_OUTPATIENT_CLINIC_OR_DEPARTMENT_OTHER): Payer: Self-pay | Admitting: Emergency Medicine

## 2023-07-08 ENCOUNTER — Emergency Department (HOSPITAL_BASED_OUTPATIENT_CLINIC_OR_DEPARTMENT_OTHER)
Admission: EM | Admit: 2023-07-08 | Discharge: 2023-07-08 | Disposition: A | Discharge status: Home / Self Care | Attending: Emergency Medicine | Admitting: Emergency Medicine

## 2023-07-08 ENCOUNTER — Other Ambulatory Visit: Payer: Self-pay

## 2023-07-08 ENCOUNTER — Emergency Department (HOSPITAL_BASED_OUTPATIENT_CLINIC_OR_DEPARTMENT_OTHER)

## 2023-07-08 DIAGNOSIS — S80211A Abrasion, right knee, initial encounter: Secondary | ICD-10-CM | POA: Insufficient documentation

## 2023-07-08 DIAGNOSIS — W28XXXA Contact with powered lawn mower, initial encounter: Secondary | ICD-10-CM | POA: Diagnosis not present

## 2023-07-08 DIAGNOSIS — Y93H2 Activity, gardening and landscaping: Secondary | ICD-10-CM | POA: Insufficient documentation

## 2023-07-08 DIAGNOSIS — M545 Low back pain, unspecified: Secondary | ICD-10-CM | POA: Diagnosis not present

## 2023-07-08 DIAGNOSIS — M25562 Pain in left knee: Secondary | ICD-10-CM

## 2023-07-08 DIAGNOSIS — S80212A Abrasion, left knee, initial encounter: Secondary | ICD-10-CM | POA: Insufficient documentation

## 2023-07-08 DIAGNOSIS — M25561 Pain in right knee: Secondary | ICD-10-CM | POA: Diagnosis present

## 2023-07-08 DIAGNOSIS — W19XXXA Unspecified fall, initial encounter: Secondary | ICD-10-CM

## 2023-07-08 NOTE — ED Provider Notes (Signed)
 [place] [facility] Provider Note   CSN: [id number] Arrival date & time: 07/08/23  1934     History {Add pertinent medical, surgical, social history, OB history to HPI:1} Chief Complaint  Patient presents with  . Fall    Anna Huang is a 48 y.o. female.  HPI     Home Medications Prior to Admission medications   Medication Sig Start Date End Date Taking? Authorizing Provider  acetaminophen (TYLENOL) 325 MG tablet Take 2 tablets (650 mg total) by mouth every 6 (six) hours as needed for mild pain (or Fever >/= 101). 01/03/20   Arrien, Mauricio Daniel, MD  amoxicillin-clavulanate (AUGMENTIN) 500-125 MG tablet Take 1 tablet by mouth every 8 (eight) hours. 03/04/22   Orvilla Blander, MD  ibuprofen (ADVIL) 400 MG tablet Take 1 tablet (400 mg total) by mouth every 8 (eight) hours as needed for moderate pain. 01/03/20   Arrien, Mauricio Daniel, MD  lidocaine (LIDODERM) 5 % Place 1 patch onto the skin daily. Remove & Discard patch within 12 hours or as directed by MD 03/05/23   Henderly, Britni A, PA-C  methocarbamol (ROBAXIN) 500 MG tablet Take 1 tablet (500 mg total) by mouth 2 (two) times daily. 03/05/23   Henderly, Britni A, PA-C  naproxen (NAPROSYN) 500 MG tablet Take 1 tablet (500 mg total) by mouth 2 (two) times daily. 03/05/23   Henderly, Britni A, PA-C  oxyCODONE (OXY IR/ROXICODONE) 5 MG immediate release tablet Take 1 tablet (5 mg total) by mouth every 6 (six) hours as needed for severe pain or breakthrough pain. 01/03/20   Arrien, Mauricio Daniel, MD  oxyCODONE-acetaminophen (PERCOCET/ROXICET) 5-325 MG tablet Take 1 tablet by mouth every 8 (eight) hours as needed for severe pain (pain score 7-10). 03/05/23   Henderly, Britni A, PA-C  promethazine (PHENERGAN) 25 MG tablet Take 1 tablet (25 mg total) by mouth every 6 (six) hours as needed for nausea or vomiting. 09/17/21   Floyd, Dan, DO  sucralfate (CARAFATE) 1 g tablet Take 1 tablet (1 g total) by mouth  4 (four) times daily -  with meals and at bedtime for 14 days. 09/12/21 09/26/21  Lowery Rue, DO      Allergies    Tramadol    Review of Systems   Review of Systems  Physical Exam Updated Vital Signs BP (!) 119/55 (BP Location: Right Arm)   Pulse 83   Temp 97.8 F (36.6 C) (Oral)   Resp 17   Ht 5\' 5"  (1.651 m)   Wt 74.4 kg   LMP 12/11/2016   SpO2 97%   BMI 27.29 kg/m  Physical Exam  ED Results / Procedures / Treatments   Labs (all labs ordered are listed, but only abnormal results are displayed) Labs Reviewed - No data to display  EKG None  Radiology DG Knee Complete 4 Views Right Result Date: 07/08/2023 CLINICAL DATA:  fall EXAM: RIGHT KNEE - COMPLETE 4+ VIEW COMPARISON:  None Available. FINDINGS: No evidence of fracture, dislocation, or joint effusion. No evidence of arthropathy or other focal bone abnormality. Soft tissues are unremarkable. IMPRESSION: Negative. Electronically Signed   By: Morgane  Naveau M.D.   On: 07/08/2023 20:39   DG Knee Complete 4 Views Left Result Date: 07/08/2023 CLINICAL DATA:  [id number] Fall [id number] EXAM: LEFT KNEE - COMPLETE 4+ VIEW COMPARISON:  None Available. FINDINGS: No evidence of fracture, dislocation, or joint effusion. Benign-appearing cortical irregularity of the proximal fibular metadiaphysis-query sessile enchondroma. No evidence of arthropathy  or other focal bone abnormality. Subcutaneus soft tissue edema of the proximal lateral leg. IMPRESSION: No acute displaced fracture or dislocation. Electronically Signed   By: Morgane  Naveau M.D.   On: 07/08/2023 20:38   DG Lumbar Spine Complete Result Date: 07/08/2023 CLINICAL DATA:  fall EXAM: LUMBAR SPINE - COMPLETE 4+ VIEW COMPARISON:  X-ray lumbar spine 03/04/2022 FINDINGS: There is no evidence of lumbar spine fracture. Multilevel mild degenerative changes of the spine. Alignment is normal. Mild intervertebral disc space narrowing at the L5-S1 level. Right upper quadrant surgical clips.  IMPRESSION: Negative for acute traumatic injury. Electronically Signed   By: Morgane  Naveau M.D.   On: 07/08/2023 20:37    Procedures Procedures  {Document cardiac monitor, telemetry assessment procedure when appropriate:1}  Medications Ordered in ED Medications - No data to display  ED Course/ Medical Decision Making/ A&P   {   Click here for ABCD2, HEART and other calculatorsREFRESH Note before signing :1}                              Medical Decision Making Amount and/or Complexity of Data Reviewed Radiology: ordered.   ***  {Document critical care time when appropriate:1} {Document review of labs and clinical decision tools ie heart score, Chads2Vasc2 etc:1}  {Document your independent review of radiology images, and any outside records:1} {Document your discussion with family members, caretakers, and with consultants:1} {Document social determinants of health affecting pt's care:1} {Document your decision making why or why not admission, treatments were needed:1} Final Clinical Impression(s) / ED Diagnoses Final diagnoses:  None    Rx / DC Orders ED Discharge Orders     None

## 2023-07-08 NOTE — ED Triage Notes (Addendum)
 Pt fell off riding lawnmower today; c/o BLE (esp knees) and lower back pain

## 2024-01-01 IMAGING — CT CT RENAL STONE PROTOCOL
2 of 4 series · 16 of 46 positions shown, 18 images · non-contrast
Comparison: 06/02/2018

CLINICAL DATA: Flank pain, kidney stone suspected



[Series 2: axial st · axial · 0.78mm/px · z∈[+796,+1221]mm · 13 of 95 slices shown, 15 images]
[im 5/95  soft-tissue]
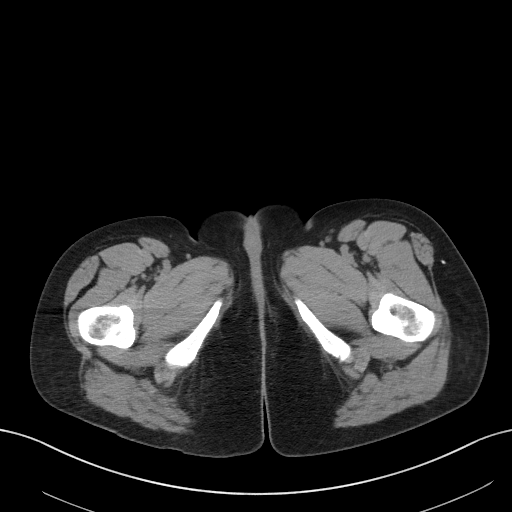
[im 5/95  bone]
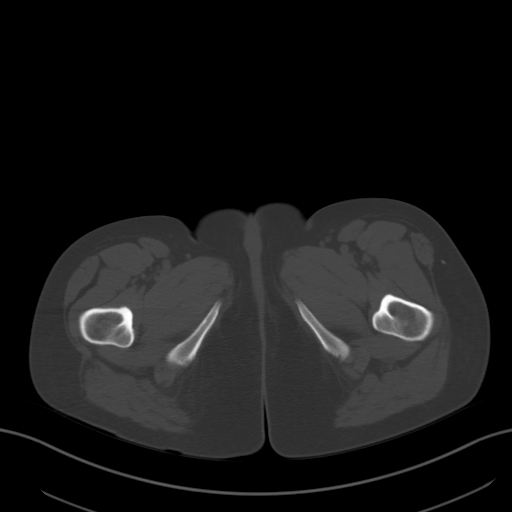
[im 13/95  soft-tissue]
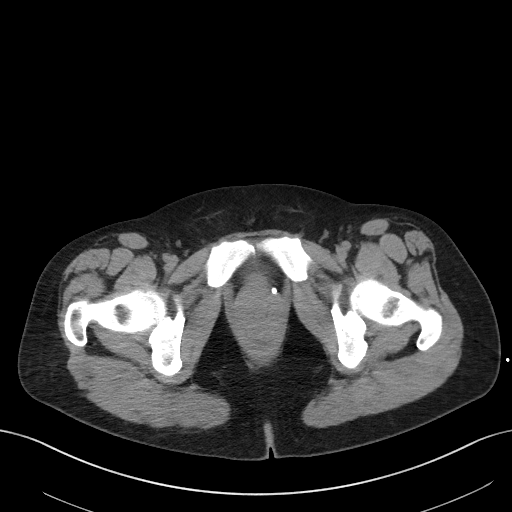
[im 21/95  soft-tissue]
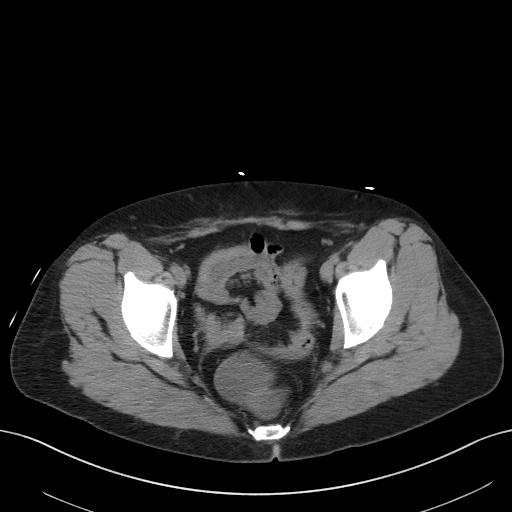
[im 25/95  soft-tissue]
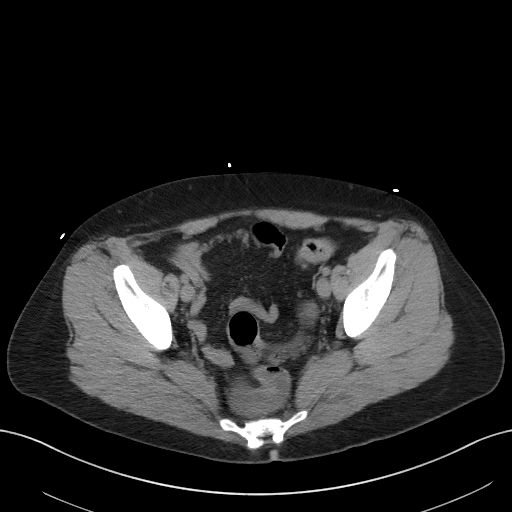
[im 33/95  soft-tissue]
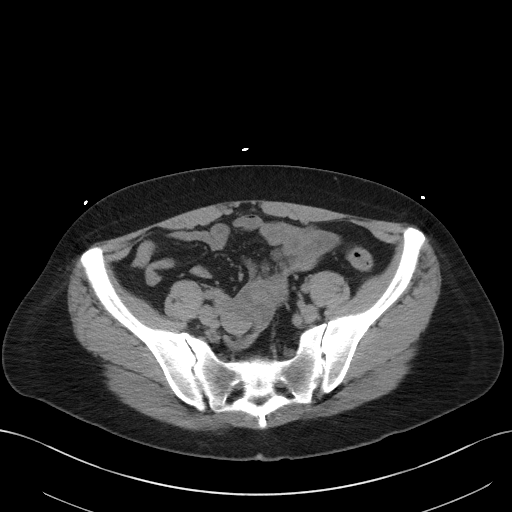
[im 41/95  soft-tissue]
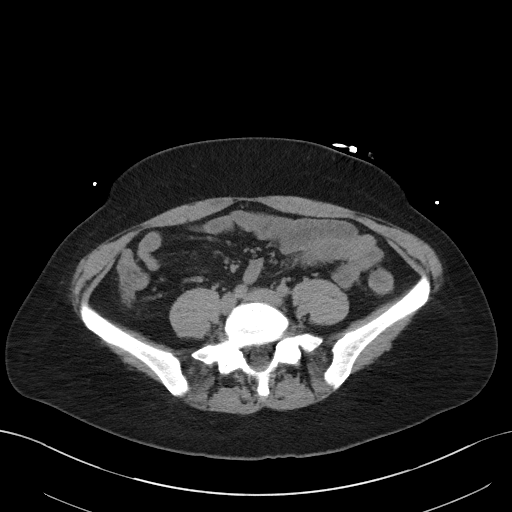
[im 50/95  soft-tissue]
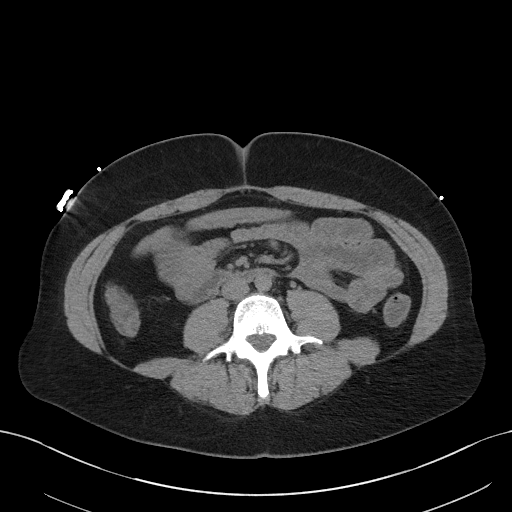
[im 54/95  soft-tissue]
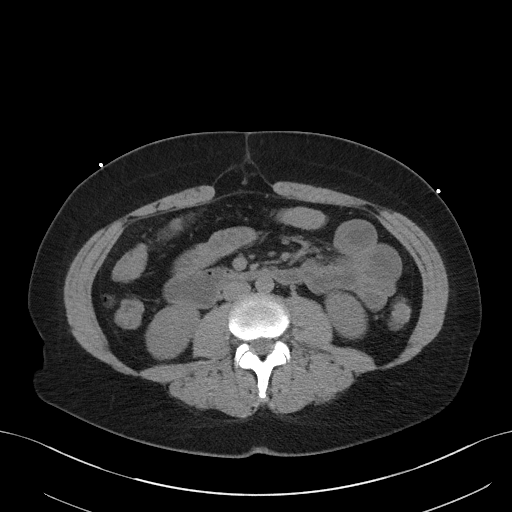
[im 62/95  soft-tissue]
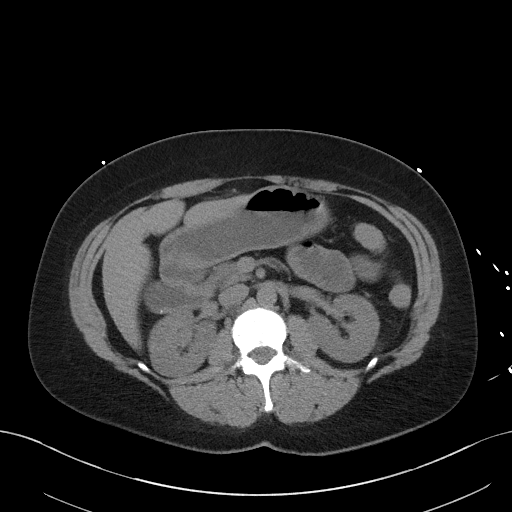
[im 62/95  bone]
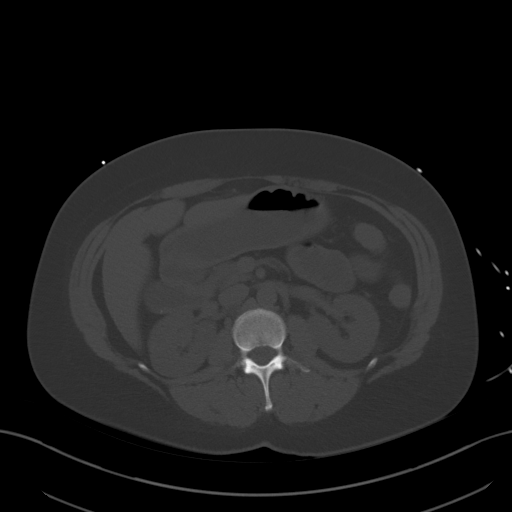
[im 70/95  soft-tissue]
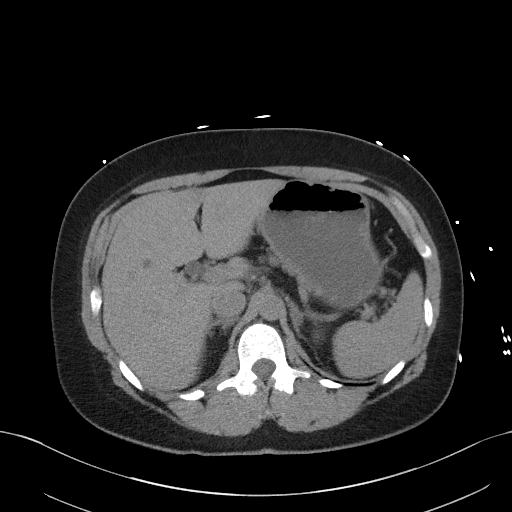
[im 74/95  soft-tissue]
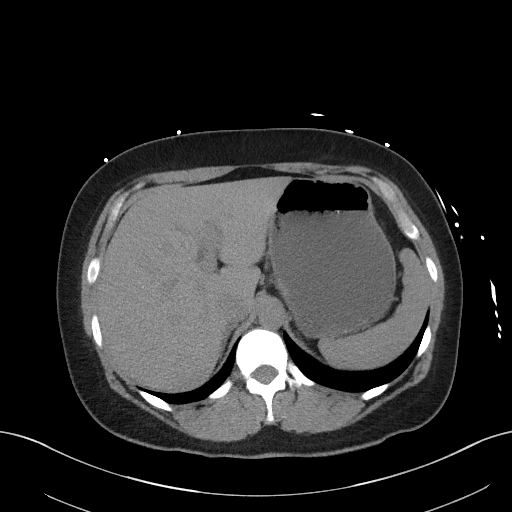
[im 82/95  soft-tissue]
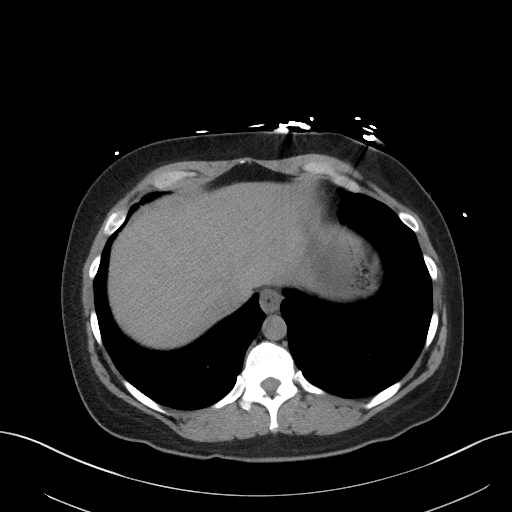
[im 90/95  soft-tissue]
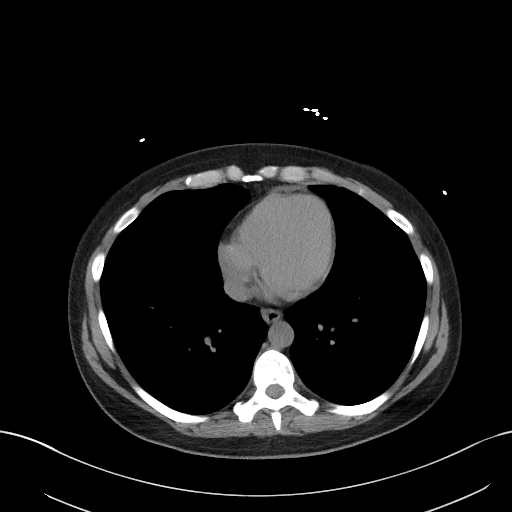

[Series 5: coronal st · coronal · 0.83mm/px · 3 of 100 slices shown]
[im 34/100  soft-tissue]
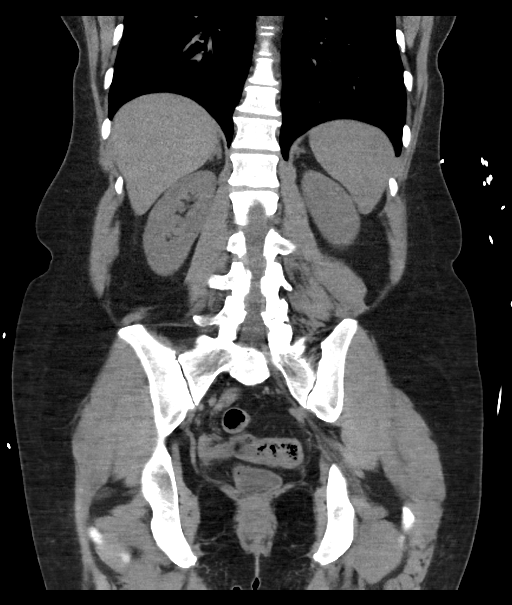
[im 45/100  soft-tissue]
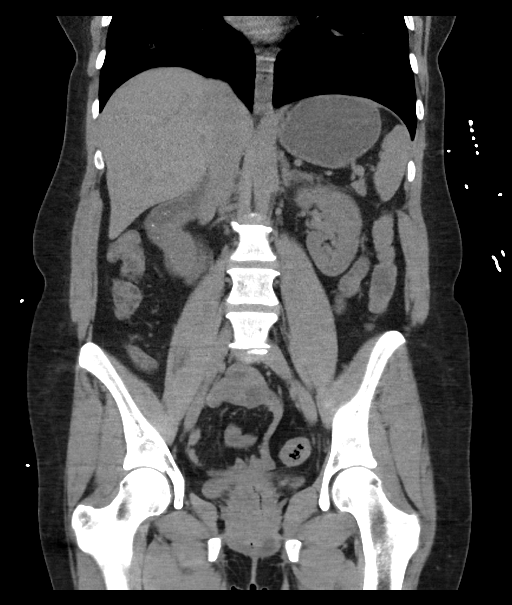
[im 56/100  soft-tissue]
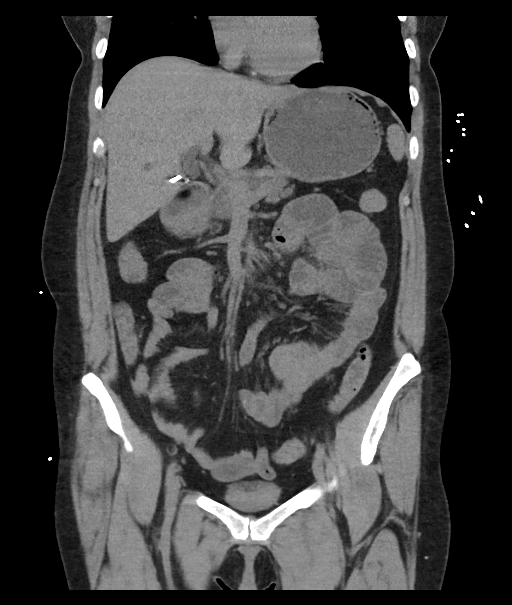

[16 of 46 positions shown; findings below may reference images not displayed]

FINDINGS: Lower chest: No acute abnormality.

Hepatobiliary: Subcentimeter low-density lesion within the right
hepatic lobe is unchanged and remains too small to characterize.
Focal fatty infiltration along the falciform ligament. Unenhanced
liver appears otherwise within normal limits. Prior cholecystectomy.
No biliary dilatation.

Pancreas: Unremarkable. No pancreatic ductal dilatation or
surrounding inflammatory changes.

Spleen: Normal in size without focal abnormality.

Adrenals/Urinary Tract: Unremarkable adrenal glands. Tiny 1-2 mm
punctate nonobstructing stone within the upper pole of the left
kidney. Kidneys appear otherwise unremarkable. No right-sided renal
calculi. No hydronephrosis. Bilateral ureters appear unremarkable.
Urinary bladder is decompressed, limiting its evaluation.

Stomach/Bowel: Stomach is moderately distended with fluid but
appears otherwise unremarkable. No dilated loops of small bowel.
Unremarkable appendix in the right lower quadrant. Scattered colonic
diverticulosis. Small volume liquid stool within the rectum.

Vascular/Lymphatic: Scattered aortoiliac atherosclerotic
calcifications without aneurysm. No abdominopelvic lymphadenopathy.

Reproductive: Status post hysterectomy. No adnexal masses.

Other: No free fluid. No abdominopelvic fluid collection. No
pneumoperitoneum. Unchanged fat containing supra umbilical hernia.

Musculoskeletal: No acute or significant osseous findings.
IMPRESSION: 1. Tiny nonobstructing stone within the upper pole of the left
kidney. No hydronephrosis.
2. Small volume liquid stool within the rectum, which can be seen in
the setting of a diarrheal illness.
3. Colonic diverticulosis without evidence of acute diverticulitis.
4. Unchanged fat containing supraumbilical hernia.
5. Aortic atherosclerosis ([id number].O).

## 2024-01-06 IMAGING — CT CT ABD-PELV W/ CM
2 of 4 series · 16 of 46 positions shown, 18 images · IV contrast (Omnipaque)
Comparison: [DATE] [DATE], [DATE].  [DATE] [DATE], [DATE].

CLINICAL DATA: Acute periumbilical abdominal pain.

EXAM:
CT ABDOMEN AND PELVIS WITH CONTRAST
TECHNIQUE: Multidetector CT imaging of the abdomen and pelvis was performed
using the standard protocol following bolus administration of
intravenous contrast.

[Series 2: axial st · axial · 0.81mm/px · z∈[+668,+1098]mm · 13 of 94 slices shown, 15 images]
[im 4/94  soft-tissue]
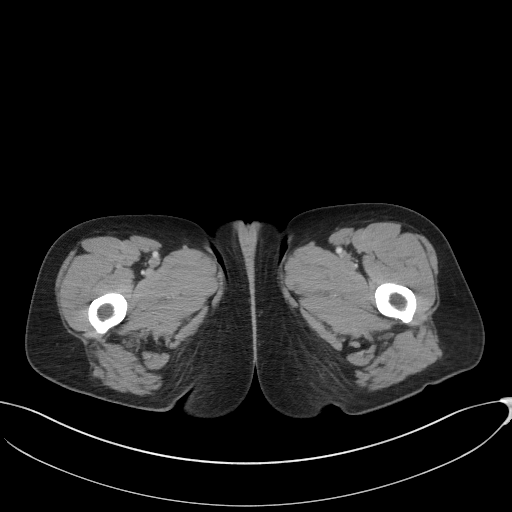
[im 4/94  bone]
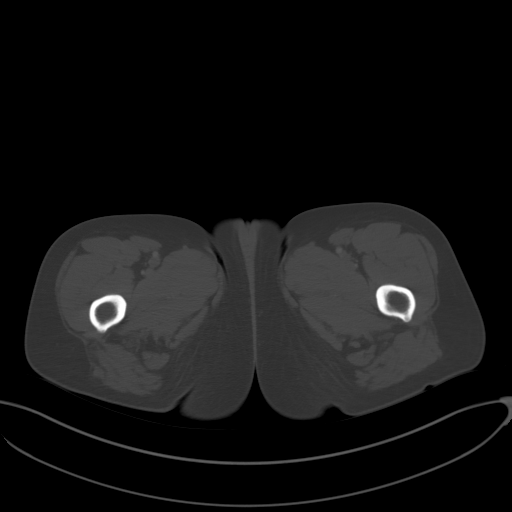
[im 12/94  soft-tissue]
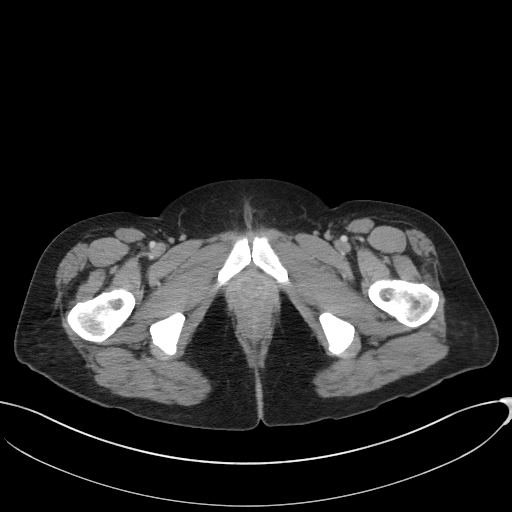
[im 20/94  soft-tissue]
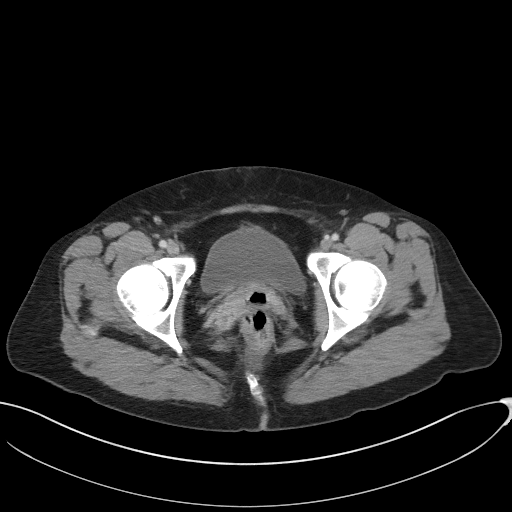
[im 28/94  soft-tissue]
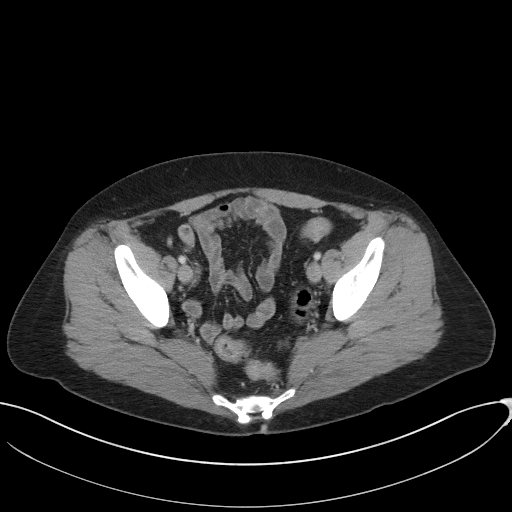
[im 32/94  soft-tissue]
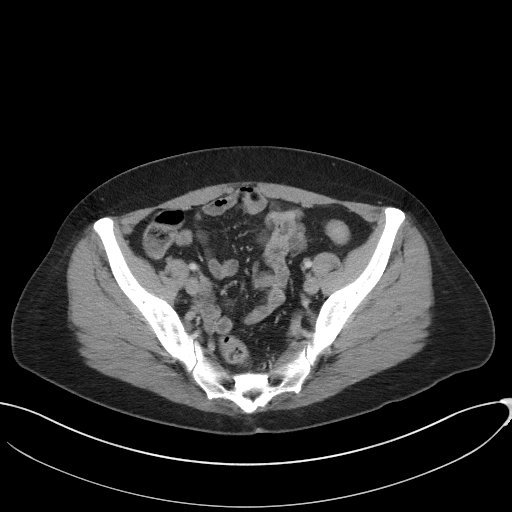
[im 39/94  soft-tissue]
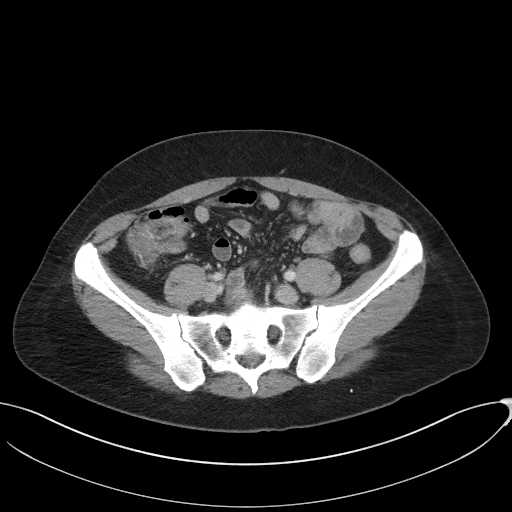
[im 47/94  soft-tissue]
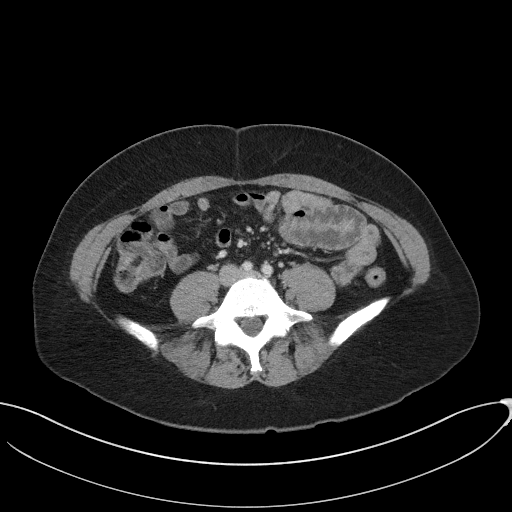
[im 55/94  soft-tissue]
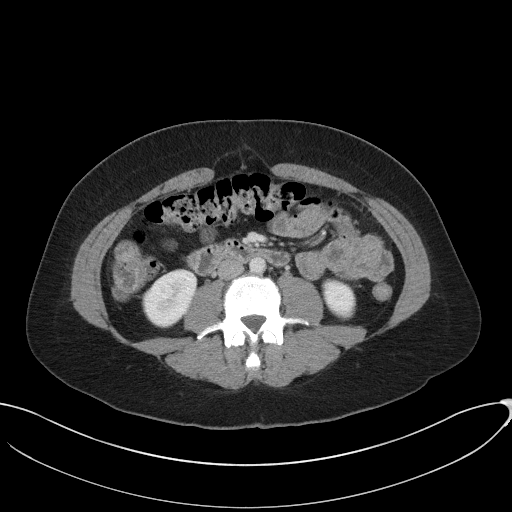
[im 63/94  soft-tissue]
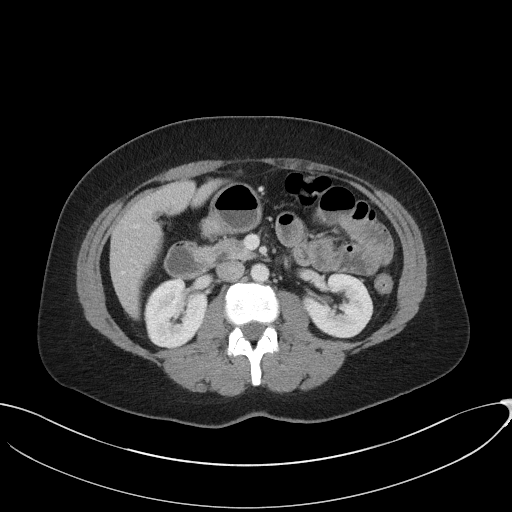
[im 63/94  bone]
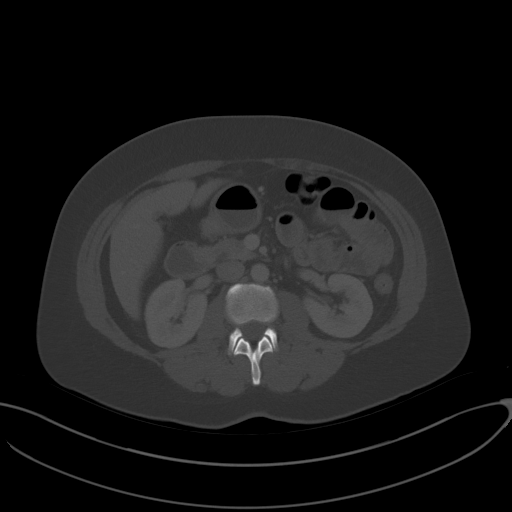
[im 66/94  soft-tissue]
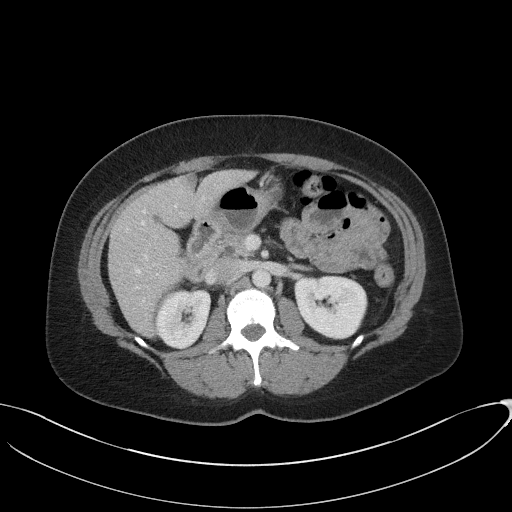
[im 74/94  soft-tissue]
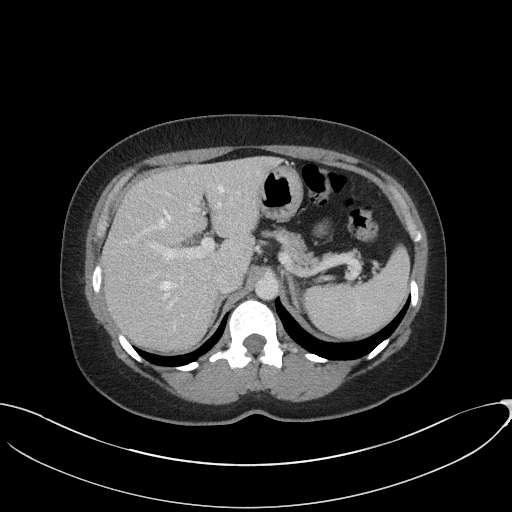
[im 82/94  soft-tissue]
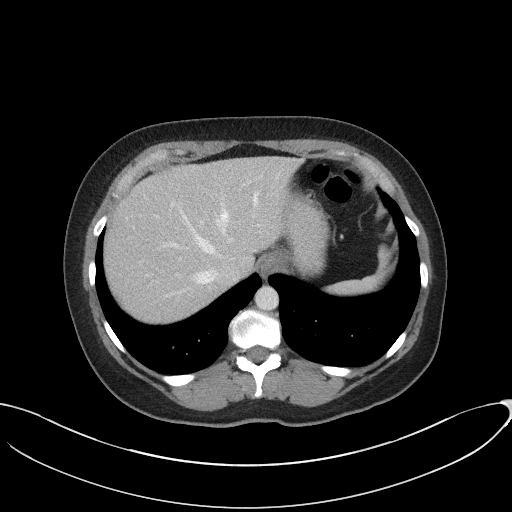
[im 90/94  soft-tissue]
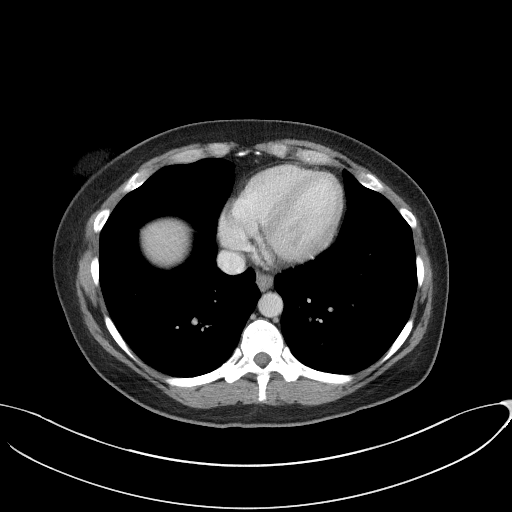

[Series 5: coronal st · coronal · 0.75mm/px · 3 of 83 slices shown]
[im 28/83  soft-tissue]
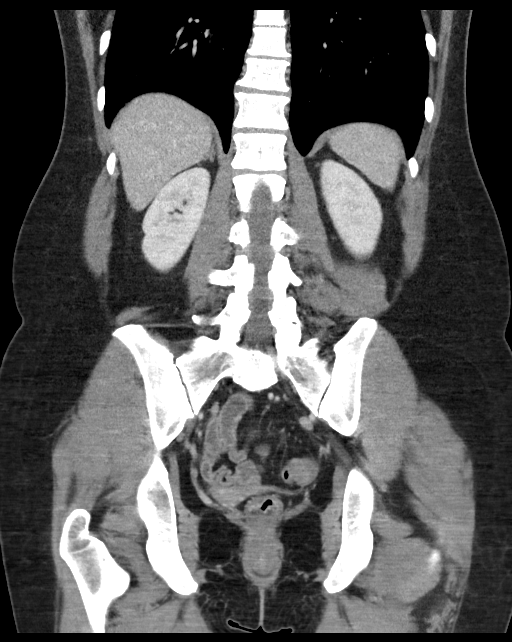
[im 37/83  soft-tissue]
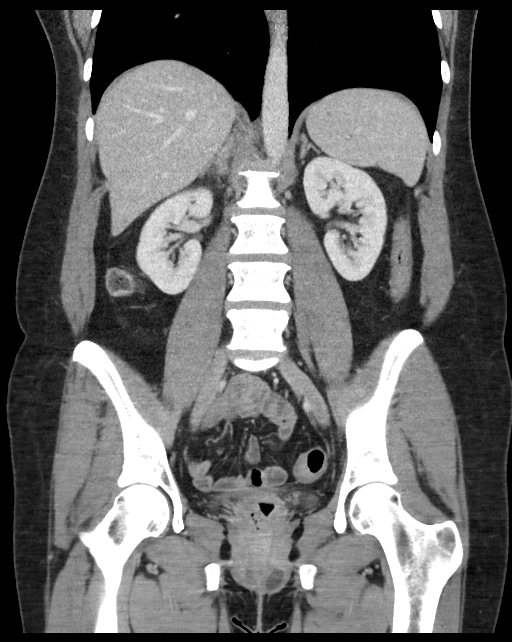
[im 46/83  soft-tissue]
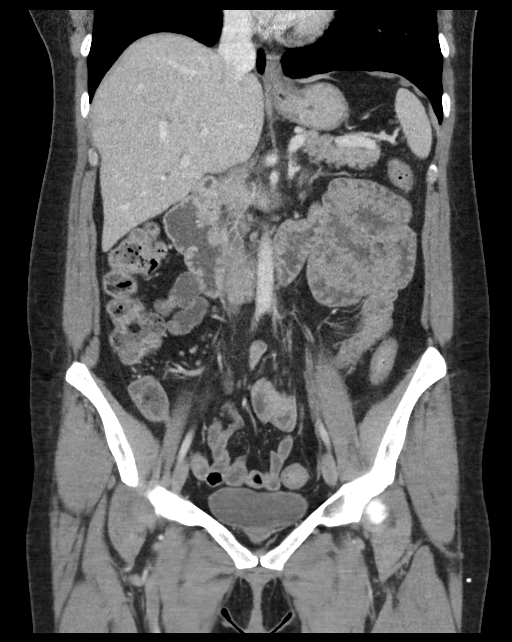

[16 of 46 positions shown; findings below may reference images not displayed]

RADIATION DOSE REDUCTION: This exam was performed according to the
departmental dose-optimization program which includes automated
exposure control, adjustment of the mA and/or kV according to
patient size and/or use of iterative reconstruction technique.

CONTRAST:  100mL OMNIPAQUE IOHEXOL 300 MG/ML  SOLN
FINDINGS: Lower chest: No acute abnormality.

Hepatobiliary: Status post cholecystectomy. No biliary dilatation is
noted. Stable small hepatic cysts are noted.

Pancreas: Unremarkable. No pancreatic ductal dilatation or
surrounding inflammatory changes.

Spleen: Normal in size without focal abnormality.

Adrenals/Urinary Tract: Adrenal glands are unremarkable. Small
nonobstructive left renal calculus is noted. No hydronephrosis or
renal obstruction is noted. Bladder is unremarkable.

Stomach/Bowel: Stomach is within normal limits. Appendix appears
normal. No evidence of bowel wall thickening, distention, or
inflammatory changes.

Vascular/Lymphatic: No significant vascular findings are present. No
enlarged abdominal or pelvic lymph nodes.

Reproductive: Status post hysterectomy. No adnexal masses.

Other: Stable fat containing supraumbilical hernia. No ascites is
noted.

Musculoskeletal: No acute or significant osseous findings.
IMPRESSION: Stable small fat containing supraumbilical ventral hernia.

Small nonobstructive left renal calculus. No hydronephrosis or renal
obstruction is noted.
